# Patient Record
Sex: Male | Born: 1990 | Race: Black or African American | Hispanic: No | Marital: Single | Smoking: Current some day smoker
Health system: Southern US, Community
[De-identification: ages and names within clinical notes are randomized; demographics above are authoritative.]

## PROBLEM LIST (undated history)

## (undated) DIAGNOSIS — T7840XA Allergy, unspecified, initial encounter: Secondary | ICD-10-CM

## (undated) DIAGNOSIS — J189 Pneumonia, unspecified organism: Secondary | ICD-10-CM

## (undated) DIAGNOSIS — G43909 Migraine, unspecified, not intractable, without status migrainosus: Secondary | ICD-10-CM

## (undated) HISTORY — DX: Pneumonia, unspecified organism: J18.9

## (undated) HISTORY — DX: Allergy, unspecified, initial encounter: T78.40XA

## (undated) HISTORY — DX: Migraine, unspecified, not intractable, without status migrainosus: G43.909

## (undated) HISTORY — PX: NO PAST SURGERIES: SHX2092

## (undated) HISTORY — PX: FRACTURE SURGERY: SHX138

---
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
## 2012-06-16 ENCOUNTER — Ambulatory Visit: Payer: BC Managed Care – PPO | Admitting: Family Medicine

## 2012-06-16 VITALS — BP 120/80 | HR 66 | Temp 98.6°F | Resp 16 | Ht 67.0 in | Wt 149.0 lb

## 2012-06-16 DIAGNOSIS — M545 Low back pain, unspecified: Secondary | ICD-10-CM | POA: Insufficient documentation

## 2012-06-16 DIAGNOSIS — M542 Cervicalgia: Secondary | ICD-10-CM | POA: Insufficient documentation

## 2012-06-16 MED ORDER — MELOXICAM 15 MG PO TABS: 15.0000 mg | ORAL_TABLET | Freq: Every day | ORAL | Status: DC

## 2012-06-16 MED ORDER — CYCLOBENZAPRINE HCL 10 MG PO TABS: 10.0000 mg | ORAL_TABLET | Freq: Three times a day (TID) | ORAL | Status: DC | PRN
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
## 2012-06-16 NOTE — Patient Instructions (Addendum)
Thank you for coming in today. You have injured the small muscles in your back and spine. Take meloxicam and Flexeril as needed for pain. Return to work in one to 3 days Come back or go to the emergency room if you notice new weakness new numbness problems walking or bowel or bladder problems.

## 2012-11-10 ENCOUNTER — Ambulatory Visit: Payer: BC Managed Care – PPO | Admitting: Physician Assistant

## 2012-11-10 VITALS — BP 124/78 | HR 60 | Temp 98.4°F | Resp 16 | Ht 66.0 in | Wt 146.0 lb

## 2012-11-10 DIAGNOSIS — J309 Allergic rhinitis, unspecified: Secondary | ICD-10-CM

## 2012-11-10 DIAGNOSIS — J029 Acute pharyngitis, unspecified: Secondary | ICD-10-CM

## 2012-11-10 NOTE — Progress Notes (Signed)
   [address], [place] [address]   Phone [phone]  Subjective:    Patient ID: Chris West, male    DOB: Jul 16, 1990, 22 y.o.   MRN: [id number]  HPI Pt presents to clinic with 1 wk h/o sore throat.  It overall has gotten worse.  Pt does not feel sick - he has known seasonal allergies but he does not feel like they are bad right now. He does not have heartburn.  His throat mainly hurts on the R side.  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  He has not had any exposures to strep that he knows of.  Using cough drops but not really helping  Review of Systems  Constitutional: Negative for fever and chills.  HENT: Positive for sore throat (gets slightly better during the day but never goes away). Negative for congestion, rhinorrhea and postnasal drip.   Respiratory: Negative for cough.        Objective:   Physical Exam  Vitals reviewed. Constitutional: He is oriented to person, place, and time. He appears well-developed and well-nourished.  HENT:  Head: Normocephalic and atraumatic.  Right Ear: Hearing, tympanic membrane, external ear and ear canal normal.  Left Ear: Hearing, tympanic membrane, external ear and ear canal normal.  Nose: Mucosal edema (pale and swollen) present.  Mouth/Throat: Uvula is midline. Edematous (mild) and lacerations present. No oropharyngeal exudate, posterior oropharyngeal edema, posterior oropharyngeal erythema or tonsillar abscesses.  Allergic shiners bilateral.  Cardiovascular: Normal rate, regular rhythm and normal heart sounds.   No murmur heard. Pulmonary/Chest: Effort normal and breath sounds normal.  Lymphadenopathy:    He has no cervical adenopathy.  Neurological: He is alert and oriented to person, place, and time.  Skin: Skin is warm and dry.  Psychiatric: He has a normal mood and affect. His behavior is normal. Judgment and thought content normal.       Assessment & Plan:  Sore throat - I think that pt is having more  significant allergies that he realizes. He has Nasonex that he will start using daily.  He will use Motrin to help with pain control.   Benny Lennert PA-C 11/10/2012 8:38 PM

## 2012-11-25 ENCOUNTER — Emergency Department (HOSPITAL_COMMUNITY): Payer: Worker's Compensation

## 2012-11-25 ENCOUNTER — Emergency Department (HOSPITAL_COMMUNITY)
Admission: EM | Admit: 2012-11-25 | Discharge: 2012-11-25 | Disposition: A | Discharge status: Home / Self Care | Payer: Worker's Compensation | Attending: Emergency Medicine | Admitting: Emergency Medicine

## 2012-11-25 ENCOUNTER — Encounter (HOSPITAL_COMMUNITY): Payer: Self-pay | Admitting: Emergency Medicine

## 2012-11-25 DIAGNOSIS — M5126 Other intervertebral disc displacement, lumbar region: Secondary | ICD-10-CM | POA: Insufficient documentation

## 2012-11-25 DIAGNOSIS — Y939 Activity, unspecified: Secondary | ICD-10-CM | POA: Insufficient documentation

## 2012-11-25 DIAGNOSIS — S0990XA Unspecified injury of head, initial encounter: Secondary | ICD-10-CM | POA: Insufficient documentation

## 2012-11-25 DIAGNOSIS — W19XXXA Unspecified fall, initial encounter: Secondary | ICD-10-CM

## 2012-11-25 DIAGNOSIS — Z23 Encounter for immunization: Secondary | ICD-10-CM | POA: Insufficient documentation

## 2012-11-25 DIAGNOSIS — Y929 Unspecified place or not applicable: Secondary | ICD-10-CM | POA: Insufficient documentation

## 2012-11-25 DIAGNOSIS — W1789XA Other fall from one level to another, initial encounter: Secondary | ICD-10-CM | POA: Insufficient documentation

## 2012-11-25 DIAGNOSIS — S76011A Strain of muscle, fascia and tendon of right hip, initial encounter: Secondary | ICD-10-CM

## 2012-11-25 DIAGNOSIS — IMO0002 Reserved for concepts with insufficient information to code with codable children: Secondary | ICD-10-CM | POA: Insufficient documentation

## 2012-11-25 DIAGNOSIS — S1093XA Contusion of unspecified part of neck, initial encounter: Secondary | ICD-10-CM | POA: Insufficient documentation

## 2012-11-25 DIAGNOSIS — S0003XA Contusion of scalp, initial encounter: Secondary | ICD-10-CM | POA: Insufficient documentation

## 2012-11-25 DIAGNOSIS — S0993XA Unspecified injury of face, initial encounter: Secondary | ICD-10-CM | POA: Insufficient documentation

## 2012-11-25 MED ORDER — OXYCODONE-ACETAMINOPHEN 5-325 MG PO TABS: 1.0000 | ORAL_TABLET | ORAL | Status: DC | PRN

## 2012-11-25 MED ORDER — FENTANYL CITRATE 0.05 MG/ML IJ SOLN
50.0000 ug | Freq: Once | INTRAMUSCULAR | Status: AC
  Administered 2012-11-25: 50 ug via INTRAVENOUS
  Filled 2012-11-25: qty 2

## 2012-11-25 MED ORDER — HYDROMORPHONE HCL PF 1 MG/ML IJ SOLN
1.0000 mg | Freq: Once | INTRAMUSCULAR | Status: DC
  Filled 2012-11-25: qty 1

## 2012-11-25 MED ORDER — OXYCODONE-ACETAMINOPHEN 5-325 MG PO TABS
2.0000 | ORAL_TABLET | Freq: Once | ORAL | Status: AC
  Administered 2012-11-25: 2 via ORAL
  Filled 2012-11-25: qty 2

## 2012-11-25 MED ORDER — KETOROLAC TROMETHAMINE 30 MG/ML IJ SOLN
30.0000 mg | Freq: Once | INTRAMUSCULAR | Status: AC
  Administered 2012-11-25: 30 mg via INTRAVENOUS
  Filled 2012-11-25: qty 1

## 2012-11-25 MED ORDER — HYDROMORPHONE HCL PF 2 MG/ML IJ SOLN: 2.0000 mg | Freq: Once | INTRAMUSCULAR | Status: DC

## 2012-11-25 MED ORDER — TETANUS-DIPHTH-ACELL PERTUSSIS 5-2.5-18.5 LF-MCG/0.5 IM SUSP
0.5000 mL | Freq: Once | INTRAMUSCULAR | Status: AC
  Administered 2012-11-25: 0.5 mL via INTRAMUSCULAR
  Filled 2012-11-25: qty 0.5

## 2012-11-25 NOTE — ED Notes (Signed)
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 Pt c/o pain to right hip/thigh, pain neck/thoracic spine. Multiple abrasions and hematoma to right side head. No LOC. A/O x4

## 2012-11-25 NOTE — ED Provider Notes (Signed)
History     CSN: [id number]  Arrival date & time 11/25/12  1229   First MD Initiated Contact with Patient 11/25/12 1236      Chief Complaint  Patient presents with  . Fall    (Consider location/radiation/quality/duration/timing/severity/associated sxs/prior treatment) HPI Comments: Patient arrives via EMS after falling about 10-12 feet from a forklift onto the concrete. Denies losing consciousness. Complains of pain in his right hip and thigh and low back. No previous back injury. No weakness, numbness or tingling. No chest pain, neck pain, abdominal pain.   The history is provided by the patient and the EMS personnel.    Past Medical History  Diagnosis Date  . Allergy     Past Surgical History  Procedure Laterality Date  . Fracture surgery      No family history on file.  History  Substance Use Topics  . Smoking status: Never Smoker   . Smokeless tobacco: Never Used  . Alcohol Use: No      Review of Systems  Constitutional: Negative for fever, activity change and appetite change.  HENT: Negative for congestion and rhinorrhea.   Respiratory: Negative for cough, chest tightness and shortness of breath.   Cardiovascular: Negative for chest pain.  Gastrointestinal: Negative for nausea, vomiting and abdominal pain.  Genitourinary: Negative for dysuria and hematuria.  Musculoskeletal: Positive for myalgias, back pain and arthralgias.  Skin: Negative for rash.  Neurological: Positive for headaches. Negative for dizziness, weakness and light-headedness.  A complete 10 system review of systems was obtained and all systems are negative except as noted in the HPI and PMH.    Allergies  Review of patient's allergies indicates no known allergies.  Home Medications   Current Outpatient Rx  Name  Route  Sig  Dispense  Refill  . Multiple Vitamin (MULTIVITAMIN) tablet   Oral   Take 1 tablet by mouth daily.         Marland Kitchen oxyCODONE-acetaminophen (PERCOCET/ROXICET) 5-325  MG per tablet   Oral   Take 1 tablet by mouth every 4 (four) hours as needed for pain.   15 tablet   0     BP 137/67  Pulse 89  Temp(Src) 98.4 F (36.9 C) (Oral)  Resp 18  SpO2 98%  Physical Exam  Constitutional: He is oriented to person, place, and time. He appears well-developed and well-nourished. No distress.  HENT:  Head: Normocephalic.  Mouth/Throat: Oropharynx is clear and moist. No oropharyngeal exudate.  Right parietal scalp hematoma  Eyes: Conjunctivae and EOM are normal.  Neck: Normal range of motion. Neck supple.  Paraspinal cervical pain without midline tenderness  Cardiovascular: Normal rate, regular rhythm, normal heart sounds and intact distal pulses.   No murmur heard. Pulmonary/Chest: Effort normal and breath sounds normal. No respiratory distress.  Abdominal: Soft. There is no tenderness. There is no rebound and no guarding.  Genitourinary:  Normal tone, no gross blood  Musculoskeletal: Normal range of motion. He exhibits tenderness.  Tender to palpation over right iliac crest, right lateral pelvis.  No shortening or external rotation. Tender to palpation diffusely in the lumbar spine  Neurological: He is alert and oriented to person, place, and time. No cranial nerve deficit. He exhibits normal muscle tone. Coordination normal.  Skin: Skin is warm.    ED Course  Procedures (including critical care time)  Labs Reviewed - No data to display Dg Chest 1 View  11/25/2012   *RADIOLOGY REPORT*  Clinical Data: Fall.  CHEST - 1 VIEW  Comparison: Thoracic spine and 05/21/2007  Findings: The lungs are clear bilaterally. Heart and mediastinum are within normal limits.  The trachea is midline.  No evidence for a pneumothorax.  The ribs are grossly intact.  IMPRESSION: No acute cardiopulmonary disease.   Original Report Authenticated By: Richarda Overlie, M.D.   Dg Hip Complete Right  11/25/2012   *RADIOLOGY REPORT*  Clinical Data: Fall and right hip pain.  RIGHT HIP -  COMPLETE 2+ VIEW  Comparison: Right femur 11/25/2012  Findings: AP view of the pelvis and two views of the right hip were obtained.  The pelvic bony ring is intact.  The sacroiliac joints are symmetric.  Right hip is located without acute fracture.  IMPRESSION: No acute bony abnormality to the right hip or pelvis.   Original Report Authenticated By: Richarda Overlie, M.D.   Dg Femur Right  11/25/2012   *RADIOLOGY REPORT*  Clinical Data: Fall and right femur pain.  RIGHT FEMUR - 2 VIEW  Comparison: Right hip 11/25/2012  Findings: Normal appearance of the right femur without fracture or dislocation.  No gross soft tissue abnormality.  IMPRESSION: No acute bony abnormality.   Original Report Authenticated By: Richarda Overlie, M.D.   Ct Head Wo Contrast  11/25/2012   *RADIOLOGY REPORT*  Clinical Data:  10 feet fall.  Right-sided head pain.  Left-sided neck pain.  The  CT HEAD WITHOUT CONTRAST CT CERVICAL SPINE WITHOUT CONTRAST  Technique:  Multidetector CT imaging of the head and cervical spine was performed following the standard protocol without intravenous contrast.  Multiplanar CT image reconstructions of the cervical spine were also generated.  Comparison:  CT of the cervical spine 05/21/2007.  CT HEAD  Findings: The no acute cortical infarct, hemorrhage, or mass lesion is present.  The ventricles are of normal size.  No significant extra-axial fluid collection is present.  A right parietal scalp hematoma is present.  There is no underlying fracture.  Mild mucosal thickening is present in the anterior ethmoid air cells bilaterally.  A fluid level is present in the left maxillary sinus.  The there is no associated fracture of the. The osseous skull is intact.  IMPRESSION:  1.  The focal the right parietal scalp hematoma.  M2 no underlying fracture. 3.  No acute intracranial abnormality. 4.  Bilateral ethmoid and left maxillary sinus disease  CT CERVICAL SPINE  Findings: The cervical spine is imaged from skull base through  the midbody of T2.  Vertebral body heights and alignment are maintained.  No acute fracture or traumatic subluxation is evident. Straightening and some reversal of the normal cervical lordosis is likely positional as the patient is in a hard collar.  IMPRESSION:  1.  Negative CT of the cervical spine.   Original Report Authenticated By: Marin Roberts, M.D.   Ct Cervical Spine Wo Contrast  11/25/2012   *RADIOLOGY REPORT*  Clinical Data:  10 feet fall.  Right-sided head pain.  Left-sided neck pain.  The  CT HEAD WITHOUT CONTRAST CT CERVICAL SPINE WITHOUT CONTRAST  Technique:  Multidetector CT imaging of the head and cervical spine was performed following the standard protocol without intravenous contrast.  Multiplanar CT image reconstructions of the cervical spine were also generated.  Comparison:  CT of the cervical spine 05/21/2007.  CT HEAD  Findings: The no acute cortical infarct, hemorrhage, or mass lesion is present.  The ventricles are of normal size.  No significant extra-axial fluid collection is present.  A right parietal scalp hematoma is present.  There is no underlying fracture.  Mild mucosal thickening is present in the anterior ethmoid air cells bilaterally.  A fluid level is present in the left maxillary sinus.  The there is no associated fracture of the. The osseous skull is intact.  IMPRESSION:  1.  The focal the right parietal scalp hematoma.  M2 no underlying fracture. 3.  No acute intracranial abnormality. 4.  Bilateral ethmoid and left maxillary sinus disease  CT CERVICAL SPINE  Findings: The cervical spine is imaged from skull base through the midbody of T2.  Vertebral body heights and alignment are maintained.  No acute fracture or traumatic subluxation is evident. Straightening and some reversal of the normal cervical lordosis is likely positional as the patient is in a hard collar.  IMPRESSION:  1.  Negative CT of the cervical spine.   Original Report Authenticated By: Marin Roberts, M.D.   Ct Lumbar Spine Wo Contrast  11/25/2012   *RADIOLOGY REPORT*  Clinical Data: Fall.  Right-sided low back pain.  CT LUMBAR SPINE WITHOUT CONTRAST  Technique:  Multidetector CT imaging of the lumbar spine was performed without intravenous contrast administration. Multiplanar CT image reconstructions were also generated.  Comparison: None.  Findings: The lumbar spine is imaged from midbody of T12 through S2- 3.  Vertebral body heights and alignment maintained.  The soft tissues are unremarkable.  There is no acute fracture or traumatic subluxation.  IMPRESSION: Negative CT of the lumbar spine.   Original Report Authenticated By: Marin Roberts, M.D.   Ct Pelvis Wo Contrast  11/25/2012   *RADIOLOGY REPORT*  Clinical Data: Fall and right hip pain.  CT PELVIS WITHOUT CONTRAST  Technique:  Multidetector CT imaging of the pelvis was performed following the standard protocol without intravenous contrast.  Comparison: Lumbar spine CT 11/25/2012  Findings: Negative for a pelvic bone fracture.  Both hips are located and no fracture in the proximal femurs.  Fluid in the urinary bladder.  No gross soft tissue abnormality.  Normal appearance of the sacroiliac joints.  There is mild retrolisthesis at L5-S1 and similar to the lumbar spine CT.  IMPRESSION: Negative pelvic CT.  No acute bony abnormality.   Original Report Authenticated By: Richarda Overlie, M.D.     1. Minor head injury, initial encounter   2. Hip strain, right, initial encounter   3. Fall, initial encounter       MDM  Patient presents via EMS after a fall while standing on a forklift at about 10 feet. Complains of pain in his right hip, thigh, neck. No loss of consciousness. Moving all extremities. No chest pain or abdominal pain.   X-rays of hip, lumbar spine and pelvis are negative for fracture. Patient reports pelvic pain appears to remain severe he is unable to ambulate. Will check CT pelvis to rule out occult fracture. CT head  and C spine negative.  CT pelvis negative for fracture. Patient still refuses to ambulate. Tenses is up muscles and does not want to bend the thigh or knee. Patient seen at bedside with Dr. Lindie Spruce. Dr. Lindie Spruce recommended additional dose of pain medication with Toradol and encouraged patient to ambulate. He does not think patient will benefit from hospital admission. No evidence of compartment syndrome or spinal cord injury. Additional medications given and signed out to Dr. Bebe Shaggy to assess response.   Glynn Octave, MD 11/25/12 [phone]

## 2012-11-25 NOTE — ED Provider Notes (Signed)
Assumed care in signout  Pt is now walking He has no focal motor deficits in his extremities He has no other new complaints Discussed strict return precautions and need to be out of work until next week Pt/family agreeable   Joya Gaskins, MD 11/25/12 1843

## 2012-11-25 NOTE — ED Notes (Signed)
Per tech, pt refuses to ambulate.  States "it hurts too much and i can't"

## 2012-11-25 NOTE — ED Notes (Signed)
Family at bedside speaking with Dr.Wickline

## 2012-11-25 NOTE — ED Notes (Signed)
NAD noted at time of d/c home 

## 2012-11-25 NOTE — ED Notes (Signed)
Pt OOB ambulating in hallway slow/steady to BR.

## 2012-12-15 ENCOUNTER — Ambulatory Visit: Payer: BC Managed Care – PPO

## 2013-05-17 ENCOUNTER — Ambulatory Visit: Payer: BC Managed Care – PPO | Admitting: Family Medicine

## 2013-05-17 ENCOUNTER — Telehealth: Payer: Self-pay | Admitting: Radiology

## 2013-05-17 ENCOUNTER — Ambulatory Visit: Payer: BC Managed Care – PPO

## 2013-05-17 VITALS — BP 120/80 | HR 106 | Temp 97.9°F | Resp 20 | Ht 66.0 in | Wt 144.0 lb

## 2013-05-17 DIAGNOSIS — R05 Cough: Secondary | ICD-10-CM

## 2013-05-17 DIAGNOSIS — R0602 Shortness of breath: Secondary | ICD-10-CM

## 2013-05-17 LAB — POCT CBC
Hemoglobin: 15.2 g/dL (ref 14.1–18.1)
Lymph, poc: 0.9 (ref 0.6–3.4)
MCH, POC: 23.7 pg — AB (ref 27–31.2)
MCHC: 31.8 g/dL (ref 31.8–35.4)
MID (cbc): 0.3 (ref 0–0.9)
MPV: 9.2 fL (ref 0–99.8)
POC LYMPH PERCENT: 15.6 %L (ref 10–50)
POC MID %: 5.8 %M (ref 0–12)
Platelet Count, POC: 229 10*3/uL (ref 142–424)
RDW, POC: 14.9 %
WBC: 5.6 10*3/uL (ref 4.6–10.2)

## 2013-05-17 MED ORDER — ALBUTEROL SULFATE (2.5 MG/3ML) 0.083% IN NEBU: 2.5000 mg | INHALATION_SOLUTION | Freq: Once | RESPIRATORY_TRACT | Status: DC

## 2013-05-17 MED ORDER — AZITHROMYCIN 250 MG PO TABS: ORAL_TABLET | ORAL | Status: DC

## 2013-05-17 MED ORDER — IPRATROPIUM BROMIDE 0.02 % IN SOLN: 0.5000 mg | Freq: Once | RESPIRATORY_TRACT | Status: DC

## 2013-05-17 MED ORDER — PREDNISONE 20 MG PO TABS: ORAL_TABLET | ORAL | Status: DC

## 2013-05-17 MED ORDER — HYDROCODONE-HOMATROPINE 5-1.5 MG/5ML PO SYRP: ORAL_SOLUTION | ORAL | Status: DC

## 2013-05-17 NOTE — Telephone Encounter (Signed)
Called and let him know that his D dimer is negative.  He will proceed with plan for azithromycin, pred and hycodan as per Eula Listen, PA-C.  He is glad that his test is negative and will follow-up if not better soon

## 2013-05-17 NOTE — Progress Notes (Signed)
Patient ID: Chris West MRN: [id number], DOB: February 01, 1991, 22 y.o. Date of Encounter: 05/17/2013, 10:33 AM  Primary Physician: No primary provider on file.  Chief Complaint:  Chief Complaint  Patient presents with  . Cough    Fever, nasal drainage x 2 days    HPI: 22 y.o. male presents with a 2 day history of cough, wheezing, SOB. Some nasal congestion and rhinorrhea. Subjective fever and chills the previous day. Nasal congestion thick and green/yellow. Cough is not productive and not associated with time of day. Some SOB and wheezing. No history of asthma. Some central chest pain, not exertional. Normal hearing. Has tried OTC cold preps without success. No GI complaints. Appetite decreased. Patient states that he felt like he was getting a cold on 05/14/13.   No sick contacts, recent antibiotics, or recent travels.   No leg trauma, sedentary periods, h/o cancer, or tobacco use.  No lower extremity swelling, erythema, or pain.   Past Medical History  Diagnosis Date  . Allergy      Home Meds: Prior to Admission medications   Medication Sig Start Date End Date Taking? Authorizing Provider  Multiple Vitamin (MULTIVITAMIN) tablet Take 1 tablet by mouth daily.   Yes Historical Provider, MD    Allergies: No Known Allergies  History   Social History  . Marital Status: Single    Spouse Name: N/A    Number of Children: N/A  . Years of Education: N/A   Occupational History  . Not on file.   Social History Main Topics  . Smoking status: Never Smoker   . Smokeless tobacco: Never Used  . Alcohol Use: No  . Drug Use: No  . Sexual Activity: Yes    Birth Control/ Protection: Condom   Other Topics Concern  . Not on file   Social History Narrative  . No narrative on file     Review of Systems: Constitutional: positive for fever, chills, and fatigue HEENT: see above Cardiovascular: positive for chest pain and tachycardia. negative for palpitations Respiratory:  positive for cough, wheezing, and SOB  Abdominal: negative for abdominal pain, nausea, vomiting or diarrhea Dermatological: negative for rash Neurologic: negative for headache   Physical Exam: Blood pressure 120/80, pulse 106, temperature 97.9 F (36.6 C), temperature source Oral, resp. rate 20, height 5\' 6"  (1.676 m), weight 144 lb (65.318 kg), SpO2 95.00%., Body mass index is 23.25 kg/(m^2). Status post albuterol and atrovent neb pulse ox improved to 98-98%. Pulse ranged from 98-101.  General: Well developed, well nourished, in no acute distress. Head: Normocephalic, atraumatic, eyes without discharge, sclera non-icteric, nares are congested. Bilateral auditory canals clear, TM's are without perforation, pearly grey with reflective cone of light bilaterally. No sinus TTP. Oral cavity moist, dentition normal. Posterior pharynx with post nasal drip and mild erythema. No peritonsillar abscess or tonsillar exudate. Uvula midline.  Neck: Supple. No thyromegaly. Full ROM. No lymphadenopathy. Lungs: Coarse breath sounds bilaterally without wheezes, rales, or rhonchi. Breathing is unlabored. Status post albuterol and atrovent neb: Patient reports some improvement, but still feels some SOB.  Heart: RRR with S1 S2. No murmurs, rubs, or gallops appreciated. Msk:  Strength and tone normal for age. Extremities: No clubbing or cyanosis. No edema. Neuro: Alert and oriented X 3. Moves all extremities spontaneously. CNII-XII grossly in tact. Psych:  Responds to questions appropriately with a normal affect.   Labs: Results for orders placed in visit on 05/17/13  POCT CBC      Result Value  Range   WBC 5.6  4.6 - 10.2 K/uL   Lymph, poc 0.9  0.6 - 3.4   POC LYMPH PERCENT 15.6  10 - 50 %L   MID (cbc) 0.3  0 - 0.9   POC MID % 5.8  0 - 12 %M   POC Granulocyte 4.4  2 - 6.9   Granulocyte percent 78.6  37 - 80 %G   RBC 6.42 (*) 4.69 - 6.13 M/uL   Hemoglobin 15.2  14.1 - 18.1 g/dL   HCT, POC 65.7  84.6 - 53.7  %   MCV 74.4 (*) 80 - 97 fL   MCH, POC 23.7 (*) 27 - 31.2 pg   MCHC 31.8  31.8 - 35.4 g/dL   RDW, POC 96.2     Platelet Count, POC 229  142 - 424 K/uL   MPV 9.2  0 - 99.8 fL  POCT INFLUENZA A/B      Result Value Range   Influenza A, POC Negative     Influenza B, POC Negative      [facility] reading (PRIMARY) by  Dr. Patsy Lager.  CXR: Negative   ASSESSMENT AND PLAN:  22 y.o. male with cough, SOB, and wheezing.  -STAT D dimer, call patient with results. If positive patient to proceed with CTA of chest and bilateral LE [place] -If negative will proceed with Azithromycin 250 MG #6 2 po first day then 1 po next 4 days no RF -Prednisone 20 mg #12 3x2, 2x2, 1x2 no RF -Hycodan #4oz 1 tsp po q 4-6 hours prn cough no RF SED -Mucinex -Tylenol/Motrin prn -Rest/fluids -RTC precautions -RTC 3-5 days if no improvement  Signed, Eula Listen, PA-C [facility] [place], [place] [address] [phone] 05/17/2013 10:33 AM

## 2013-05-17 NOTE — Telephone Encounter (Signed)
D dimer negative 0.30 call report.

## 2013-07-19 ENCOUNTER — Ambulatory Visit: Payer: BC Managed Care – PPO | Admitting: Emergency Medicine

## 2013-07-19 VITALS — BP 118/70 | HR 91 | Temp 99.2°F | Resp 18 | Ht 66.0 in | Wt 150.4 lb

## 2013-07-19 DIAGNOSIS — J111 Influenza due to unidentified influenza virus with other respiratory manifestations: Secondary | ICD-10-CM

## 2013-07-19 MED ORDER — PROMETHAZINE-CODEINE 6.25-10 MG/5ML PO SYRP: 5.0000 mL | ORAL_SOLUTION | Freq: Four times a day (QID) | ORAL | Status: DC | PRN

## 2013-07-19 MED ORDER — OSELTAMIVIR PHOSPHATE 75 MG PO CAPS: 75.0000 mg | ORAL_CAPSULE | Freq: Two times a day (BID) | ORAL | Status: DC

## 2013-07-19 NOTE — Progress Notes (Signed)
[facility] [address], [place] [id number] [phone]  Date:  07/19/2013   Name:  Chris KettleKenneth West   DOB:  May 25, 1991   MRN:  [id number]  PCP:  No primary provider on file.    Chief Complaint: Cough, Shortness of Breath, Fatigue and Sore Throat   History of Present Illness:  Chris KettleKenneth West is a 23 y.o. very pleasant male patient who presents with the following:  Ill since Sunday with fever and chills, cough that is productive of mucopurulent sputum.  Wheezing and exertional shortness of breath.  Has mucopurulent nasal drainage and post nasal drainage.  Malaise and weakness and fatigue.  No improvement with over the counter medications or other home remedies. Denies other complaint or health concern today.   Patient Active Problem List   Diagnosis Date Noted  . LBP (low back pain) 06/16/2012  . Neck pain 06/16/2012    Past Medical History  Diagnosis Date  . Allergy     Past Surgical History  Procedure Laterality Date  . Fracture surgery      History  Substance Use Topics  . Smoking status: Never Smoker   . Smokeless tobacco: Never Used  . Alcohol Use: No    Family History  Problem Relation Age of Onset  . Breast cancer Mother   . Healthy Father   . Healthy Sister   . Breast cancer Maternal Grandmother   . Healthy Maternal Grandfather   . Healthy Paternal Grandmother   . Healthy Paternal Grandfather     No Known Allergies  Medication list has been reviewed and updated.  Current Outpatient Prescriptions on File Prior to Visit  Medication Sig Dispense Refill  . azithromycin (ZITHROMAX Z-PAK) 250 MG tablet 2 tabs po first day, then 1 tab po next 4 days  6 tablet  0  . HYDROcodone-homatropine (HYCODAN) 5-1.5 MG/5ML syrup 1 TSP PO Q 4-6 HOURS PRN COUGH  120 mL  0  . Multiple Vitamin (MULTIVITAMIN) tablet Take 1 tablet by mouth daily.      . predniSONE (DELTASONE) 20 MG tablet 3 PO FOR 2 DAYS, 2 PO FOR 2 DAYS, 1 PO FOR 2 DAYS  12  tablet  0   Current Facility-Administered Medications on File Prior to Visit  Medication Dose Route Frequency Provider Last Rate Last Dose  . albuterol (PROVENTIL) (2.5 MG/3ML) 0.083% nebulizer solution 2.5 mg  2.5 mg Nebulization Once Ryan M Dunn, PA-C      . ipratropium (ATROVENT) nebulizer solution 0.5 mg  0.5 mg Nebulization Once Sondra Bargesyan M Dunn, PA-C        Review of Systems:  As per HPI, otherwise negative.    Physical Examination: Filed Vitals:   07/19/13 0934  BP: 118/70  Pulse: 91  Temp: 99.2 F (37.3 C)  Resp: 18   Filed Vitals:   07/19/13 0934  Height: 5\' 6"  (1.676 m)  Weight: 150 lb 6.4 oz (68.221 kg)   Body mass index is 24.29 kg/(m^2). Ideal Body Weight: Weight in (lb) to have BMI = 25: 154.6  GEN: WDWN, NAD, Non-toxic, A & O x 3 HEENT: Atraumatic, Normocephalic. Neck supple. No masses, No LAD. Ears and Nose: No external deformity. CV: RRR, No M/G/R. No JVD. No thrill. No extra heart sounds. PULM: CTA B, no wheezes, crackles, rhonchi. No retractions. No resp. distress. No accessory muscle use. ABD: S, NT, ND, +BS. No rebound. No HSM. EXTR: No c/c/e NEURO Normal gait.  PSYCH: Normally interactive. Conversant. Not depressed  or anxious appearing.  Calm demeanor.    Assessment and Plan: Influenza tamiflu Phen c cod   Signed,  Phillips Odor, MD

## 2013-07-19 NOTE — Patient Instructions (Signed)
inInfluenza, Adult Influenza ("the flu") is a viral infection of the respiratory tract. It occurs more often in winter months because people spend more time in close contact with one another. Influenza can make you feel very sick. Influenza easily spreads from person to person (contagious). CAUSES  Influenza is caused by a virus that infects the respiratory tract. You can catch the virus by breathing in droplets from an infected person's cough or sneeze. You can also catch the virus by touching something that was recently contaminated with the virus and then touching your mouth, nose, or eyes. SYMPTOMS  Symptoms typically last 4 to 10 days and may include:  Fever.  Chills.  Headache, body aches, and muscle aches.  Sore throat.  Chest discomfort and cough.  Poor appetite.  Weakness or feeling tired.  Dizziness.  Nausea or vomiting. DIAGNOSIS  Diagnosis of influenza is often made based on your history and a physical exam. A nose or throat swab test can be done to confirm the diagnosis. RISKS AND COMPLICATIONS You may be at risk for a more severe case of influenza if you smoke cigarettes, have diabetes, have chronic heart disease (such as heart failure) or lung disease (such as asthma), or if you have a weakened immune system. Elderly people and pregnant women are also at risk for more serious infections. The most common complication of influenza is a lung infection (pneumonia). Sometimes, this complication can require emergency medical care and may be life-threatening. PREVENTION  An annual influenza vaccination (flu shot) is the best way to avoid getting influenza. An annual flu shot is now routinely recommended for all adults in the [place] TREATMENT  In mild cases, influenza goes away on its own. Treatment is directed at relieving symptoms. For more severe cases, your caregiver may prescribe antiviral medicines to shorten the sickness. Antibiotic medicines are not effective, because the  infection is caused by a virus, not by bacteria. HOME CARE INSTRUCTIONS  Only take over-the-counter or prescription medicines for pain, discomfort, or fever as directed by your caregiver.  Use a cool mist humidifier to make breathing easier.  Get plenty of rest until your temperature returns to normal. This usually takes 3 to 4 days.  Drink enough fluids to keep your urine clear or pale yellow.  Cover your mouth and nose when coughing or sneezing, and wash your hands well to avoid spreading the virus.  Stay home from work or school until your fever has been gone for at least 1 full day. SEEK MEDICAL CARE IF:   You have chest pain or a deep cough that worsens or produces more mucus.  You have nausea, vomiting, or diarrhea. SEEK IMMEDIATE MEDICAL CARE IF:   You have difficulty breathing, shortness of breath, or your skin or nails turn bluish.  You have severe neck pain or stiffness.  You have a severe headache, facial pain, or earache.  You have a worsening or recurring fever.  You have nausea or vomiting that cannot be controlled. MAKE SURE YOU:  Understand these instructions.  Will watch your condition.  Will get help right away if you are not doing well or get worse. Document Released: 06/28/2000 Document Revised: 12/31/2011 Document Reviewed: 09/30/2011 [facility] Patient Information 2014 [employer] [place].

## 2013-12-09 ENCOUNTER — Ambulatory Visit: Payer: BC Managed Care – PPO | Admitting: Family Medicine

## 2013-12-09 VITALS — BP 122/86 | HR 60 | Temp 97.9°F | Resp 16 | Ht 66.5 in | Wt 147.8 lb

## 2013-12-09 DIAGNOSIS — K529 Noninfective gastroenteritis and colitis, unspecified: Secondary | ICD-10-CM

## 2013-12-09 DIAGNOSIS — K5289 Other specified noninfective gastroenteritis and colitis: Secondary | ICD-10-CM

## 2013-12-09 MED ORDER — PROMETHAZINE HCL 25 MG PO TABS: 25.0000 mg | ORAL_TABLET | Freq: Four times a day (QID) | ORAL | Status: DC | PRN

## 2013-12-09 NOTE — Patient Instructions (Signed)
Viral Gastroenteritis Viral gastroenteritis is also known as stomach flu. This condition affects the stomach and intestinal tract. It can cause sudden diarrhea and vomiting. The illness typically lasts 3 to 8 days. Most people develop an immune response that eventually gets rid of the virus. While this natural response develops, the virus can make you quite ill. CAUSES  Many different viruses can cause gastroenteritis, such as rotavirus or noroviruses. You can catch one of these viruses by consuming contaminated food or water. You may also catch a virus by sharing utensils or other personal items with an infected person or by touching a contaminated surface. SYMPTOMS  The most common symptoms are diarrhea and vomiting. These problems can cause a severe loss of body fluids (dehydration) and a body salt (electrolyte) imbalance. Other symptoms may include:  Fever.  Headache.  Fatigue.  Abdominal pain. DIAGNOSIS  Your caregiver can usually diagnose viral gastroenteritis based on your symptoms and a physical exam. A stool sample may also be taken to test for the presence of viruses or other infections. TREATMENT  This illness typically goes away on its own. Treatments are aimed at rehydration. The most serious cases of viral gastroenteritis involve vomiting so severely that you are not able to keep fluids down. In these cases, fluids must be given through an intravenous line (IV). HOME CARE INSTRUCTIONS   Drink enough fluids to keep your urine clear or pale yellow. Drink small amounts of fluids frequently and increase the amounts as tolerated.  Ask your caregiver for specific rehydration instructions.  Avoid:  Foods high in sugar.  Alcohol.  Carbonated drinks.  Tobacco.  Juice.  Caffeine drinks.  Extremely hot or cold fluids.  Fatty, greasy foods.  Too much intake of anything at one time.  Dairy products until 24 to 48 hours after diarrhea stops.  You may consume probiotics.  Probiotics are active cultures of beneficial bacteria. They may lessen the amount and number of diarrheal stools in adults. Probiotics can be found in yogurt with active cultures and in supplements.  Wash your hands well to avoid spreading the virus.  Only take over-the-counter or prescription medicines for pain, discomfort, or fever as directed by your caregiver. Do not give aspirin to children. Antidiarrheal medicines are not recommended.  Ask your caregiver if you should continue to take your regular prescribed and over-the-counter medicines.  Keep all follow-up appointments as directed by your caregiver. SEEK IMMEDIATE MEDICAL CARE IF:   You are unable to keep fluids down.  You do not urinate at least once every 6 to 8 hours.  You develop shortness of breath.  You notice blood in your stool or vomit. This may look like coffee grounds.  You have abdominal pain that increases or is concentrated in one small area (localized).  You have persistent vomiting or diarrhea.  You have a fever.  The patient is a child younger than 3 months, and he or she has a fever.  The patient is a child older than 3 months, and he or she has a fever and persistent symptoms.  The patient is a child older than 3 months, and he or she has a fever and symptoms suddenly get worse.  The patient is a baby, and he or she has no tears when crying. MAKE SURE YOU:   Understand these instructions.  Will watch your condition.  Will get help right away if you are not doing well or get worse. Document Released: 07/01/2005 Document Revised: 09/23/2011 Document Reviewed: 04/17/2011   Exit[employer] Patient Information 2014 [employer].  

## 2013-12-09 NOTE — Progress Notes (Addendum)
   Subjective:  This chart was scribed for Chris Sorenson, MD by Bronson Curb, ED Scribe. This patient was seen in room Room/bed 9 and the patient's care was started at 10:45 AM.   Patient ID: Chris West, male    DOB: [birth year], 22 y.o.   MRN: [id number]  Chief Complaint  Patient presents with  . Emesis    this morning  . Diarrhea    this morning    HPI  HPI Comments: Chris West is a 23 y.o. male who presents to [facility] complaining of diarrhea onset this morning. Patient states he went out to eat and became sick later that night. There is associated nausea, emesis, mild abdominal cramps, and chills. He states he can tolerate liquids, but prefers not to drink anything. He denies fevers and urinary symptoms. Patient has not taken anything to relieve the symptoms.  Past Medical History  Diagnosis Date  . Allergy    Current Outpatient Prescriptions on File Prior to Visit  Medication Sig Dispense Refill  . Multiple Vitamin (MULTIVITAMIN) tablet Take 1 tablet by mouth daily.       Current Facility-Administered Medications on File Prior to Visit  Medication Dose Route Frequency Provider Last Rate Last Dose  . albuterol (PROVENTIL) (2.5 MG/3ML) 0.083% nebulizer solution 2.5 mg  2.5 mg Nebulization Once Ryan M Dunn, PA-C      . ipratropium (ATROVENT) nebulizer solution 0.5 mg  0.5 mg Nebulization Once Ryan M Dunn, PA-C       No Known Allergies  Review of Systems  Constitutional: Positive for chills. Negative for fever.  Gastrointestinal: Positive for nausea, vomiting and diarrhea.      BP 122/86  Pulse 60  Temp(Src) 97.9 F (36.6 C) (Oral)  Resp 16  Ht 5' 6.5" (1.689 m)  Wt 147 lb 12.8 oz (67.042 kg)  BMI 23.50 kg/m2  SpO2 100%  Objective:   Physical Exam  Nursing note and vitals reviewed. Constitutional: He is oriented to person, place, and time. He appears well-developed and well-nourished. No distress.  HENT:  Head: Normocephalic and atraumatic.  Eyes: EOM  are normal.  Neck: Neck supple. No tracheal deviation present. No mass and no thyromegaly present.  Cardiovascular: Normal rate, regular rhythm and normal heart sounds.   Pulmonary/Chest: Effort normal and breath sounds normal. No respiratory distress.  Abdominal: Soft. Bowel sounds are normal. He exhibits no distension and no mass. There is no hepatosplenomegaly. There is no tenderness. There is no CVA tenderness.  Musculoskeletal: Normal range of motion.  Lymphadenopathy:    He has no cervical adenopathy.  Neurological: He is alert and oriented to person, place, and time.  Skin: Skin is warm and dry.  Psychiatric: He has a normal mood and affect. His behavior is normal.          Assessment & Plan:    Okay to call for Zofran if needed.  Gastroenteritis  Meds ordered this encounter  Medications  . promethazine (PHENERGAN) 25 MG tablet    Sig: Take 1 tablet (25 mg total) by mouth every 6 (six) hours as needed for nausea or vomiting.    Dispense:  20 tablet    Refill:  0    I personally performed the services described in this documentation, which was scribed in my presence. The recorded information has been reviewed and considered, and addended by me as needed.  Chris Sorenson, MD MPH

## 2014-03-07 ENCOUNTER — Ambulatory Visit (INDEPENDENT_AMBULATORY_CARE_PROVIDER_SITE_OTHER): Payer: BC Managed Care – PPO | Admitting: Emergency Medicine

## 2014-03-07 VITALS — BP 102/68 | HR 57 | Temp 98.2°F | Resp 16 | Ht 66.0 in | Wt 149.0 lb

## 2014-03-07 DIAGNOSIS — M545 Low back pain, unspecified: Secondary | ICD-10-CM

## 2014-03-07 MED ORDER — NAPROXEN SODIUM 550 MG PO TABS: 550.0000 mg | ORAL_TABLET | Freq: Two times a day (BID) | ORAL | Status: DC

## 2014-03-07 MED ORDER — TRAMADOL HCL 50 MG PO TABS: 50.0000 mg | ORAL_TABLET | Freq: Three times a day (TID) | ORAL | Status: DC | PRN

## 2014-03-07 MED ORDER — CYCLOBENZAPRINE HCL 10 MG PO TABS: 10.0000 mg | ORAL_TABLET | Freq: Three times a day (TID) | ORAL | Status: DC | PRN
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
## 2014-03-07 NOTE — Patient Instructions (Signed)
Back Pain, Adult Low back pain is very common. About 1 in 5 people have back pain.The cause of low back pain is rarely dangerous. The pain often gets better over time.About half of people with a sudden onset of back pain feel better in just 2 weeks. About 8 in 10 people feel better by 6 weeks.  CAUSES Some common causes of back pain include:  Strain of the muscles or ligaments supporting the spine.  Wear and tear (degeneration) of the spinal discs.  Arthritis.  Direct injury to the back. DIAGNOSIS Most of the time, the direct cause of low back pain is not known.However, back pain can be treated effectively even when the exact cause of the pain is unknown.Answering your caregiver's questions about your overall health and symptoms is one of the most accurate ways to make sure the cause of your pain is not dangerous. If your caregiver needs more information, he or she may order lab work or imaging tests (X-rays or MRIs).However, even if imaging tests show changes in your back, this usually does not require surgery. HOME CARE INSTRUCTIONS For many people, back pain returns.Since low back pain is rarely dangerous, it is often a condition that people can learn to manageon their own.   Remain active. It is stressful on the back to sit or stand in one place. Do not sit, drive, or stand in one place for more than 30 minutes at a time. Take short walks on level surfaces as soon as pain allows.Try to increase the length of time you walk each day.  Do not stay in bed.Resting more than 1 or 2 days can delay your recovery.  Do not avoid exercise or work.Your body is made to move.It is not dangerous to be active, even though your back may hurt.Your back will likely heal faster if you return to being active before your pain is gone.  Pay attention to your body when you bend and lift. Many people have less discomfortwhen lifting if they bend their knees, keep the load close to their bodies,and  avoid twisting. Often, the most comfortable positions are those that put less stress on your recovering back.  Find a comfortable position to sleep. Use a firm mattress and lie on your side with your knees slightly bent. If you lie on your back, put a pillow under your knees.  Only take over-the-counter or prescription medicines as directed by your caregiver. Over-the-counter medicines to reduce pain and inflammation are often the most helpful.Your caregiver may prescribe muscle relaxant drugs.These medicines help dull your pain so you can more quickly return to your normal activities and healthy exercise.  Put ice on the injured area.  Put ice in a plastic bag.  Place a towel between your skin and the bag.  Leave the ice on for 15-20 minutes, 03-04 times a day for the first 2 to 3 days. After that, ice and heat may be alternated to reduce pain and spasms.  Ask your caregiver about trying back exercises and gentle massage. This may be of some benefit.  Avoid feeling anxious or stressed.Stress increases muscle tension and can worsen back pain.It is important to recognize when you are anxious or stressed and learn ways to manage it.Exercise is a great option. SEEK MEDICAL CARE IF:  You have pain that is not relieved with rest or medicine.  You have pain that does not improve in 1 week.  You have new symptoms.  You are generally not feeling well. SEEK   IMMEDIATE MEDICAL CARE IF:   You have pain that radiates from your back into your legs.  You develop new bowel or bladder control problems.  You have unusual weakness or numbness in your arms or legs.  You develop nausea or vomiting.  You develop abdominal pain.  You feel faint. Document Released: 07/01/2005 Document Revised: 12/31/2011 Document Reviewed: 11/02/2013 Exit[employer] Patient Information 2015 [employer]. This information is not intended to replace advice given to you by your health care provider. Make sure you  discuss any questions you have with your health care provider.  

## 2014-03-09 ENCOUNTER — Emergency Department (HOSPITAL_COMMUNITY)
Admission: EM | Admit: 2014-03-09 | Discharge: 2014-03-10 | Disposition: A | Discharge status: Home / Self Care | Payer: Worker's Compensation | Attending: Emergency Medicine | Admitting: Emergency Medicine

## 2014-03-09 ENCOUNTER — Encounter (HOSPITAL_COMMUNITY): Payer: Self-pay | Admitting: Emergency Medicine

## 2014-03-09 DIAGNOSIS — M549 Dorsalgia, unspecified: Secondary | ICD-10-CM | POA: Diagnosis present

## 2014-03-09 DIAGNOSIS — R5383 Other fatigue: Secondary | ICD-10-CM | POA: Diagnosis not present

## 2014-03-09 DIAGNOSIS — M5441 Lumbago with sciatica, right side: Secondary | ICD-10-CM

## 2014-03-09 DIAGNOSIS — R5381 Other malaise: Secondary | ICD-10-CM | POA: Insufficient documentation

## 2014-03-09 DIAGNOSIS — M5442 Lumbago with sciatica, left side: Secondary | ICD-10-CM

## 2014-03-09 DIAGNOSIS — M5430 Sciatica, unspecified side: Secondary | ICD-10-CM | POA: Diagnosis not present

## 2014-03-09 LAB — URINALYSIS, ROUTINE W REFLEX MICROSCOPIC
BILIRUBIN URINE: NEGATIVE
Glucose, UA: NEGATIVE mg/dL
Hgb urine dipstick: NEGATIVE
Ketones, ur: NEGATIVE mg/dL
Leukocytes, UA: NEGATIVE
NITRITE: NEGATIVE
PROTEIN: NEGATIVE mg/dL
SPECIFIC GRAVITY, URINE: 1.029 (ref 1.005–1.030)
UROBILINOGEN UA: 1 mg/dL (ref 0.0–1.0)
pH: 6 (ref 5.0–8.0)

## 2014-03-09 MED ORDER — PREDNISONE 10 MG PO TABS: 20.0000 mg | ORAL_TABLET | Freq: Every day | ORAL | Status: DC

## 2014-03-09 MED ORDER — DEXAMETHASONE SODIUM PHOSPHATE 10 MG/ML IJ SOLN
10.0000 mg | Freq: Once | INTRAMUSCULAR | Status: AC
  Administered 2014-03-09: 10 mg via INTRAVENOUS
  Filled 2014-03-09: qty 1

## 2014-03-09 MED ORDER — HYDROCODONE-ACETAMINOPHEN 5-325 MG PO TABS: 1.0000 | ORAL_TABLET | ORAL | Status: DC | PRN

## 2014-03-09 MED ORDER — ONDANSETRON HCL 4 MG/2ML IJ SOLN
4.0000 mg | Freq: Once | INTRAMUSCULAR | Status: AC
  Administered 2014-03-09: 4 mg via INTRAVENOUS
  Filled 2014-03-09: qty 2

## 2014-03-09 MED ORDER — MORPHINE SULFATE 4 MG/ML IJ SOLN
4.0000 mg | Freq: Once | INTRAMUSCULAR | Status: AC
  Administered 2014-03-09: 4 mg via INTRAVENOUS
  Filled 2014-03-09: qty 1

## 2014-03-09 MED ORDER — CYCLOBENZAPRINE HCL 10 MG PO TABS: 10.0000 mg | ORAL_TABLET | Freq: Two times a day (BID) | ORAL | Status: DC | PRN

## 2014-03-09 MED ORDER — DIAZEPAM 5 MG PO TABS
5.0000 mg | ORAL_TABLET | Freq: Once | ORAL | Status: AC
  Administered 2014-03-10: 5 mg via ORAL
  Filled 2014-03-09: qty 1
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
## 2014-03-09 NOTE — ED Notes (Signed)
Pt reports lower back pain for over 1 week and now having pain in groin when he urinates. Pt also reports nausea.

## 2014-03-09 NOTE — Discharge Instructions (Signed)
Back Pain, Adult Low back pain is very common. About 1 in 5 people have back pain.The cause of low back pain is rarely dangerous. The pain often gets better over time.About half of people with a sudden onset of back pain feel better in just 2 weeks. About 8 in 10 people feel better by 6 weeks.  CAUSES Some common causes of back pain include:  Strain of the muscles or ligaments supporting the spine.  Wear and tear (degeneration) of the spinal discs.  Arthritis.  Direct injury to the back. DIAGNOSIS Most of the time, the direct cause of low back pain is not known.However, back pain can be treated effectively even when the exact cause of the pain is unknown.Answering your caregiver's questions about your overall health and symptoms is one of the most accurate ways to make sure the cause of your pain is not dangerous. If your caregiver needs more information, he or she may order lab work or imaging tests (X-rays or MRIs).However, even if imaging tests show changes in your back, this usually does not require surgery. HOME CARE INSTRUCTIONS For many people, back pain returns.Since low back pain is rarely dangerous, it is often a condition that people can learn to manageon their own.   Remain active. It is stressful on the back to sit or stand in one place. Do not sit, drive, or stand in one place for more than 30 minutes at a time. Take short walks on level surfaces as soon as pain allows.Try to increase the length of time you walk each day.  Do not stay in bed.Resting more than 1 or 2 days can delay your recovery.  Do not avoid exercise or work.Your body is made to move.It is not dangerous to be active, even though your back may hurt.Your back will likely heal faster if you return to being active before your pain is gone.  Pay attention to your body when you bend and lift. Many people have less discomfortwhen lifting if they bend their knees, keep the load close to their bodies,and  avoid twisting. Often, the most comfortable positions are those that put less stress on your recovering back.  Find a comfortable position to sleep. Use a firm mattress and lie on your side with your knees slightly bent. If you lie on your back, put a pillow under your knees.  Only take over-the-counter or prescription medicines as directed by your caregiver. Over-the-counter medicines to reduce pain and inflammation are often the most helpful.Your caregiver may prescribe muscle relaxant drugs.These medicines help dull your pain so you can more quickly return to your normal activities and healthy exercise.  Put ice on the injured area.  Put ice in a plastic bag.  Place a towel between your skin and the bag.  Leave the ice on for 15-20 minutes, 03-04 times a day for the first 2 to 3 days. After that, ice and heat may be alternated to reduce pain and spasms.  Ask your caregiver about trying back exercises and gentle massage. This may be of some benefit.  Avoid feeling anxious or stressed.Stress increases muscle tension and can worsen back pain.It is important to recognize when you are anxious or stressed and learn ways to manage it.Exercise is a great option. SEEK MEDICAL CARE IF:  You have pain that is not relieved with rest or medicine.  You have pain that does not improve in 1 week.  You have new symptoms.  You are generally not feeling well. SEEK   IMMEDIATE MEDICAL CARE IF:   You have pain that radiates from your back into your legs.  You develop new bowel or bladder control problems.  You have unusual weakness or numbness in your arms or legs.  You develop nausea or vomiting.  You develop abdominal pain.  You feel faint. Document Released: 07/01/2005 Document Revised: 12/31/2011 Document Reviewed: 11/02/2013 [employer] Patient Information 2015 [employer]. This information is not intended to replace advice given to you by your health care provider. Make sure you  discuss any questions you have with your health care provider.  

## 2014-03-12 NOTE — ED Provider Notes (Signed)
Medical screening examination/treatment/procedure(s) were performed by non-physician practitioner and as supervising physician I was immediately available for consultation/collaboration.   EKG Interpretation None        Mirian Mo, MD 03/12/14 [phone]

## 2014-07-25 ENCOUNTER — Ambulatory Visit (INDEPENDENT_AMBULATORY_CARE_PROVIDER_SITE_OTHER): Payer: BC Managed Care – PPO | Admitting: Internal Medicine

## 2014-07-25 ENCOUNTER — Other Ambulatory Visit (INDEPENDENT_AMBULATORY_CARE_PROVIDER_SITE_OTHER): Payer: BC Managed Care – PPO

## 2014-07-25 ENCOUNTER — Encounter: Payer: Self-pay | Admitting: Internal Medicine

## 2014-07-25 VITALS — BP 122/76 | HR 89 | Temp 98.8°F | Resp 20 | Ht 65.0 in | Wt 158.0 lb

## 2014-07-25 DIAGNOSIS — B349 Viral infection, unspecified: Secondary | ICD-10-CM

## 2014-07-25 DIAGNOSIS — Z0000 Encounter for general adult medical examination without abnormal findings: Secondary | ICD-10-CM

## 2014-07-25 LAB — BASIC METABOLIC PANEL
BUN: 10 mg/dL (ref 6–23)
CHLORIDE: 99 meq/L (ref 96–112)
CO2: 29 meq/L (ref 19–32)
CREATININE: 1.3 mg/dL (ref 0.4–1.5)
Calcium: 9.4 mg/dL (ref 8.4–10.5)
GFR: 90.73 mL/min (ref >60.00)
GLUCOSE: 106 mg/dL — AB (ref 70–99)
Potassium: 3.5 mEq/L (ref 3.5–5.1)
Sodium: 135 mEq/L (ref 135–145)

## 2014-07-25 NOTE — Progress Notes (Signed)
Pre visit review using our clinic review tool, if applicable. No additional management support is needed unless otherwise documented below in the visit note. 

## 2014-07-25 NOTE — Patient Instructions (Addendum)
You are negative for the flu but we think that you have a virus which is causing your symptoms. Antibiotics do not help viruses. We recommend tylenol or ibuprofen for body aches or fevers. Be sure to stay well hydrated and get plenty of rest.  We will give you a work note to stay off until you are fever free for 24 hours and off tylenol and ibuprofen.  Upper Respiratory Infection, Adult An upper respiratory infection (URI) is also sometimes known as the common cold. The upper respiratory tract includes the nose, sinuses, throat, trachea, and bronchi. Bronchi are the airways leading to the lungs. Most people improve within 1 week, but symptoms can last up to 2 weeks. A residual cough may last even longer.  CAUSES Many different viruses can infect the tissues lining the upper respiratory tract. The tissues become irritated and inflamed and often become very moist. Mucus production is also common. A cold is contagious. You can easily spread the virus to others by oral contact. This includes kissing, sharing a glass, coughing, or sneezing. Touching your mouth or nose and then touching a surface, which is then touched by another person, can also spread the virus. SYMPTOMS  Symptoms typically develop 1 to 3 days after you come in contact with a cold virus. Symptoms vary from person to person. They may include:  Runny nose.  Sneezing.  Nasal congestion.  Sinus irritation.  Sore throat.  Loss of voice (laryngitis).  Cough.  Fatigue.  Muscle aches.  Loss of appetite.  Headache.  Low-grade fever. DIAGNOSIS  You might diagnose your own cold based on familiar symptoms, since most people get a cold 2 to 3 times a year. Your caregiver can confirm this based on your exam. Most importantly, your caregiver can check that your symptoms are not due to another disease such as strep throat, sinusitis, pneumonia, asthma, or epiglottitis. Blood tests, throat tests, and X-rays are not necessary to  diagnose a common cold, but they may sometimes be helpful in excluding other more serious diseases. Your caregiver will decide if any further tests are required. RISKS AND COMPLICATIONS  You may be at risk for a more severe case of the common cold if you smoke cigarettes, have chronic heart disease (such as heart failure) or lung disease (such as asthma), or if you have a weakened immune system. The very young and very old are also at risk for more serious infections. Bacterial sinusitis, middle ear infections, and bacterial pneumonia can complicate the common cold. The common cold can worsen asthma and chronic obstructive pulmonary disease (COPD). Sometimes, these complications can require emergency medical care and may be life-threatening. PREVENTION  The best way to protect against getting a cold is to practice good hygiene. Avoid oral or hand contact with people with cold symptoms. Wash your hands often if contact occurs. There is no clear evidence that vitamin C, vitamin E, echinacea, or exercise reduces the chance of developing a cold. However, it is always recommended to get plenty of rest and practice good nutrition. TREATMENT  Treatment is directed at relieving symptoms. There is no cure. Antibiotics are not effective, because the infection is caused by a virus, not by bacteria. Treatment may include:  Increased fluid intake. Sports drinks offer valuable electrolytes, sugars, and fluids.  Breathing heated mist or steam (vaporizer or shower).  Eating chicken soup or other clear broths, and maintaining good nutrition.  Getting plenty of rest.  Using gargles or lozenges for comfort.  Controlling fevers  with ibuprofen or acetaminophen as directed by your caregiver.  Increasing usage of your inhaler if you have asthma. Zinc gel and zinc lozenges, taken in the first 24 hours of the common cold, can shorten the duration and lessen the severity of symptoms. Pain medicines may help with fever,  muscle aches, and throat pain. A variety of non-prescription medicines are available to treat congestion and runny nose. Your caregiver can make recommendations and may suggest nasal or lung inhalers for other symptoms.  HOME CARE INSTRUCTIONS   Only take over-the-counter or prescription medicines for pain, discomfort, or fever as directed by your caregiver.  Use a warm mist humidifier or inhale steam from a shower to increase air moisture. This may keep secretions moist and make it easier to breathe.  Drink enough water and fluids to keep your urine clear or pale yellow.  Rest as needed.  Return to work when your temperature has returned to normal or as your caregiver advises. You may need to stay home longer to avoid infecting others. You can also use a face mask and careful hand washing to prevent spread of the virus. SEEK MEDICAL CARE IF:   After the first few days, you feel you are getting worse rather than better.  You need your caregiver's advice about medicines to control symptoms.  You develop chills, worsening shortness of breath, or brown or red sputum. These may be signs of pneumonia.  You develop yellow or brown nasal discharge or pain in the face, especially when you bend forward. These may be signs of sinusitis.  You develop a fever, swollen neck glands, pain with swallowing, or white areas in the back of your throat. These may be signs of strep throat. SEEK IMMEDIATE MEDICAL CARE IF:   You have a fever.  You develop severe or persistent headache, ear pain, sinus pain, or chest pain.  You develop wheezing, a prolonged cough, cough up blood, or have a change in your usual mucus (if you have chronic lung disease).  You develop sore muscles or a stiff neck. Document Released: 12/25/2000 Document Revised: 09/23/2011 Document Reviewed: 10/06/2013 [facility] Patient Information 2015 [place], [place]. This information is not intended to replace advice given to you by your health  care provider. Make sure you discuss any questions you have with your health care provider.

## 2014-07-26 ENCOUNTER — Ambulatory Visit: Payer: Self-pay | Admitting: Internal Medicine

## 2014-07-26 DIAGNOSIS — Z0000 Encounter for general adult medical examination without abnormal findings: Secondary | ICD-10-CM | POA: Insufficient documentation

## 2014-07-26 DIAGNOSIS — B349 Viral infection, unspecified: Secondary | ICD-10-CM | POA: Insufficient documentation

## 2014-07-26 LAB — LIPID PANEL
Cholesterol: 138 mg/dL (ref 0–200)
HDL: 53.2 mg/dL (ref >39.00)
LDL Cholesterol: 70 mg/dL (ref 0–99)
NONHDL: 84.8
TRIGLYCERIDES: 72 mg/dL (ref 0.0–149.0)
Total CHOL/HDL Ratio: 3
VLDL: 14.4 mg/dL (ref 0.0–40.0)

## 2014-07-26 NOTE — Assessment & Plan Note (Signed)
POCT for influenza negative. Treatment with hydration, rest, tylenol or ibuprofen. Note for work given to be home until afebrile and off anti-pyretics for 24 hours.

## 2014-07-26 NOTE — Progress Notes (Signed)
   Subjective:    Patient ID: Chris West, male    DOB: 1990/09/04, 24 y.o.   MRN: [id number]  HPI The patient is a 24 YO man who is coming in today to establish care. He is generally healthy but started feeling poorly yesterday. He had fevers last night and is having some muscle aches as well. He stayed home from work today and is not sure if he will be able to make it tomorrow. He has had headaches in the past but hasn't been troubled by them lately. He has been taking tylenol for the fever since yesterday. No chest pains, SOB, cough, congestion. Denies GERD, abdominal pains, joint pains.   Review of Systems  Constitutional: Positive for fever, chills, activity change, appetite change and fatigue. Negative for unexpected weight change.  HENT: Negative for congestion, postnasal drip, rhinorrhea, sinus pressure and sore throat.   Respiratory: Negative for cough, chest tightness, shortness of breath and wheezing.   Cardiovascular: Negative for chest pain, palpitations and leg swelling.  Gastrointestinal: Negative for nausea, abdominal pain, diarrhea, constipation and abdominal distention.  Genitourinary: Negative.   Musculoskeletal: Positive for myalgias. Negative for back pain and arthralgias.  Skin: Negative.   Neurological: Negative.       Objective:   Physical Exam  Constitutional: He is oriented to person, place, and time. He appears well-developed and well-nourished.  HENT:  Head: Normocephalic and atraumatic.  Right Ear: External ear normal.  Left Ear: External ear normal.  Nose: Nose normal.  Mouth/Throat: Oropharynx is clear and moist.  Eyes: EOM are normal.  Neck: Normal range of motion.  Cardiovascular: Normal rate and regular rhythm.   Pulmonary/Chest: Effort normal and breath sounds normal. No respiratory distress. He has no wheezes. He has no rales.  Abdominal: Soft. Bowel sounds are normal. He exhibits no distension. There is no tenderness. There is no rebound.    Musculoskeletal: He exhibits no edema.  Neurological: He is alert and oriented to person, place, and time. Coordination normal.  Skin: Skin is warm and dry.   Filed Vitals:   07/25/14 1403  BP: 122/76  Pulse: 89  Temp: 98.8 F (37.1 C)  TempSrc: Oral  Resp: 20  Height: 5\' 5"  (1.651 m)  Weight: 158 lb (71.668 kg)  SpO2: 98%      Assessment & Plan:

## 2014-07-26 NOTE — Assessment & Plan Note (Signed)
Check lipid panel, BMP.

## 2014-09-12 ENCOUNTER — Ambulatory Visit (INDEPENDENT_AMBULATORY_CARE_PROVIDER_SITE_OTHER): Payer: BC Managed Care – PPO | Admitting: Physician Assistant

## 2014-09-12 VITALS — BP 110/70 | HR 64 | Temp 97.5°F | Resp 16 | Ht 66.0 in | Wt 159.0 lb

## 2014-09-12 DIAGNOSIS — B309 Viral conjunctivitis, unspecified: Secondary | ICD-10-CM

## 2014-09-12 DIAGNOSIS — J069 Acute upper respiratory infection, unspecified: Secondary | ICD-10-CM

## 2014-09-12 MED ORDER — CETIRIZINE-PSEUDOEPHEDRINE ER 5-120 MG PO TB12: 1.0000 | ORAL_TABLET | ORAL | Status: AC

## 2014-09-12 MED ORDER — POLYMYXIN B-TRIMETHOPRIM 10000-0.1 UNIT/ML-% OP SOLN: 1.0000 [drp] | Freq: Four times a day (QID) | OPHTHALMIC | Status: DC

## 2014-09-12 NOTE — Progress Notes (Signed)
09/12/2014 at 1:47 PM  [name] / DOB: 1990/12/23 / MRN: [id number]  The patient has Viral illness and Routine general medical examination at a health care facility on his problem list.  SUBJECTIVE  Chief compalaint: Conjunctivitis   History of present illness: Mr. Berns is 24 y.o. well appearing male presenting for the evaluation of possible right pink eye which he states is mild. This started 1 day ago and is worsening . Associated symptoms include itching, yellow drainage, and eye matting this morning. He also endorses sore throat.  He denies eye trauma, change in vision, HA, history of diabetes and HTN. While nothing aggravates his symptoms, and closing his eyes alleviates his eye symptoms. He has tried nothing. He reports no sick contacts.  He denies a history of seasonal allergies and asthma.    He  has a past medical history of Allergy and Migraine.    He has a current medication list which includes the following prescription(s): cetirizine-pseudoephedrine and trimethoprim-polymyxin b.  Mr. Rogala has No Known Allergies. He  reports that he has never smoked. He has never used smokeless tobacco. He reports that he does not drink alcohol or use illicit drugs. He  reports that he currently engages in sexual activity. He reports using the following method of birth control/protection: Condom. The patient  has past surgical history that includes Fracture surgery.  His family history includes Breast cancer in his maternal grandmother and mother; Healthy in his father, maternal grandfather, paternal grandfather, paternal grandmother, and sister.  ROS  Per HPI  OBJECTIVE  His  height is  (1.676 m) and weight is 159 lb (72.122 kg). His temperature is 97.5 F (36.4 C). His blood pressure is 110/70 and his pulse is 64. His respiration is 16 and oxygen saturation is 99%.  The patient's body mass index is 25.68 kg/(m^2).  Physical Exam  Constitutional: He is oriented to  person, place, and time. He appears well-developed and well-nourished. No distress.  HENT:  Right Ear: Hearing, tympanic membrane, external ear and ear canal normal.  Left Ear: Hearing, tympanic membrane, external ear and ear canal normal.  Nose: Mucosal edema present. No sinus tenderness. Right sinus exhibits no maxillary sinus tenderness and no frontal sinus tenderness. Left sinus exhibits no maxillary sinus tenderness and no frontal sinus tenderness.  Mouth/Throat: Uvula is midline and mucous membranes are normal. Mucous membranes are not pale, not dry and not cyanotic. Posterior oropharyngeal erythema present. No oropharyngeal exudate, posterior oropharyngeal edema or tonsillar abscesses.  Eyes:    Cardiovascular: Normal rate and regular rhythm.   Respiratory: Effort normal and breath sounds normal. He has no wheezes. He has no rales.  Musculoskeletal: Normal range of motion.  Lymphadenopathy:    He has no cervical adenopathy.  Neurological: He is alert and oriented to person, place, and time.  Skin: Skin is warm and dry. He is not diaphoretic.  Psychiatric: He has a normal mood and affect.    No results found for this or any previous visit (from the past 24 hour(s)).   ASSESSMENT & PLAN  Arrington was seen today for conjunctivitis.  Diagnoses and all orders for this visit:  Viral conjunctivitis: Etiology viral vs allergic vs bacterial. Patient advised to try warm compresses for the next three days to alleviate his symptoms.  Should this fail, or should he begin to have purulent drainage, advised he fill the below and take as instructed.  Also advised that he start Zyrtec-D with warm compress to eliminate  an allergic component.   Orders: -     trimethoprim-polymyxin b (POLYTRIM) ophthalmic solution; Place 1 drop into both eyes every 6 (six) hours.  Viral URI Orders: -     cetirizine-pseudoephedrine (ZYRTEC-D ALLERGY & CONGESTION) 5-120 MG per tablet; Take 1 tablet by mouth every  morning. Do not use OTC cold remedies with this medication.    The patient was advised to call or come back to clinic if he does not see an improvement in symptoms, or worsens with the above plan.   Deliah BostonMichael Ameli Sangiovanni, MHS, PA-C [facility] 09/12/2014 1:47 PM

## 2014-09-12 NOTE — Patient Instructions (Signed)

## 2014-10-26 ENCOUNTER — Ambulatory Visit (INDEPENDENT_AMBULATORY_CARE_PROVIDER_SITE_OTHER): Payer: BC Managed Care – PPO | Admitting: Physician Assistant

## 2014-10-26 VITALS — BP 134/52 | HR 100 | Temp 99.6°F | Resp 16 | Ht 66.0 in | Wt 163.0 lb

## 2014-10-26 DIAGNOSIS — J069 Acute upper respiratory infection, unspecified: Secondary | ICD-10-CM

## 2014-10-26 DIAGNOSIS — J029 Acute pharyngitis, unspecified: Secondary | ICD-10-CM | POA: Diagnosis not present

## 2014-10-26 DIAGNOSIS — B9789 Other viral agents as the cause of diseases classified elsewhere: Secondary | ICD-10-CM

## 2014-10-26 LAB — POCT RAPID STREP A (OFFICE): RAPID STREP A SCREEN: NEGATIVE

## 2014-10-26 MED ORDER — IBUPROFEN 600 MG PO TABS: 600.0000 mg | ORAL_TABLET | Freq: Three times a day (TID) | ORAL | Status: DC | PRN

## 2014-10-26 MED ORDER — CETIRIZINE-PSEUDOEPHEDRINE ER 5-120 MG PO TB12: 1.0000 | ORAL_TABLET | ORAL | Status: AC

## 2014-10-26 MED ORDER — HYDROCODONE-HOMATROPINE 5-1.5 MG/5ML PO SYRP: 5.0000 mL | ORAL_SOLUTION | Freq: Three times a day (TID) | ORAL | Status: DC | PRN

## 2014-10-26 NOTE — Progress Notes (Signed)
10/26/2014 at 8:20 PM  Chris West / DOB: [birth year] / MRN: [id number]  The patient  does not have any active problems on file.  SUBJECTIVE  Chief complaint: Sinusitis   History of present illness: Mr. Chris West is 24 y.o. ill appearing male presenting for 3 days of sore throat.  His throat pain is 5/10.  He reports associated cold chills, nasal congestion, sneezing.  He denies eye and ear symptoms, as well as cough.  He has not had the flu shot.   He  has a past medical history of Allergy and Migraine.    He has a current medication list which includes the following prescription(s): cetirizine-pseudoephedrine, hydrocodone-homatropine, and ibuprofen.  Mr. [name] has No Known Allergies. He  reports that he has never smoked. He has never used smokeless tobacco. He reports that he does not drink alcohol or use illicit drugs. He  reports that he currently engages in sexual activity. He reports using the following method of birth control/protection: Condom. The patient  has past surgical history that includes Fracture surgery.  His family history includes Breast cancer in his maternal grandmother and mother; Healthy in his father, maternal grandfather, paternal grandfather, paternal grandmother, and sister.  Review of Systems  Constitutional: Positive for fever and malaise/fatigue. Negative for chills and diaphoresis.  HENT: Positive for congestion and sore throat.   Eyes: Negative for discharge.  Respiratory: Negative for cough and shortness of breath.   Cardiovascular: Negative for chest pain and palpitations.  Gastrointestinal: Negative for heartburn, nausea, vomiting and abdominal pain.  Musculoskeletal: Negative for myalgias.  Skin: Negative.   Neurological: Negative for dizziness, weakness and headaches.    OBJECTIVE  His  height is  (1.676 m) and weight is 163 lb (73.936 kg). His oral temperature is 99.6 F (37.6 C). His blood pressure is 134/52 and his pulse is  100. His respiration is 16 and oxygen saturation is 98%.  The patient's body mass index is 26.32 kg/(m^2).  Physical Exam  Constitutional: He is oriented to person, place, and time. He appears well-developed and well-nourished. No distress.  HENT:  Right Ear: Hearing, tympanic membrane, external ear and ear canal normal.  Left Ear: Hearing, tympanic membrane, external ear and ear canal normal.  Nose: Mucosal edema present. No sinus tenderness. Right sinus exhibits no maxillary sinus tenderness and no frontal sinus tenderness. Left sinus exhibits no maxillary sinus tenderness and no frontal sinus tenderness.  Mouth/Throat: Uvula is midline and mucous membranes are normal. Mucous membranes are not pale, not dry and not cyanotic. Posterior oropharyngeal erythema present. No oropharyngeal exudate, posterior oropharyngeal edema or tonsillar abscesses.  Cardiovascular: Normal rate and regular rhythm.   Respiratory: Effort normal and breath sounds normal. He has no wheezes. He has no rales.  Musculoskeletal: Normal range of motion.  Lymphadenopathy:    He has no cervical adenopathy.  Neurological: He is alert and oriented to person, place, and time.  Skin: Skin is warm and dry. He is not diaphoretic.  Psychiatric: He has a normal mood and affect.    Results for orders placed or performed in visit on 10/26/14 (from the past 24 hour(s))  POCT rapid strep A     Status: None   Collection Time: 10/26/14  7:24 PM  Result Value Ref Range   Rapid Strep A Screen Negative Negative    ASSESSMENT & PLAN  [name] was seen today for sinusitis.  Diagnoses and all orders for this visit:  Sore throat Orders: -  POCT rapid strep A -     Culture, Group A Strep  Viral URI with cough Orders: -     ibuprofen (ADVIL,MOTRIN) 600 MG tablet; Take 1 tablet (600 mg total) by mouth every 8 (eight) hours as needed. -     HYDROcodone-homatropine (HYCODAN) 5-1.5 MG/5ML syrup; Take 5 mLs by mouth every 8 (eight)  hours as needed for cough. -     cetirizine-pseudoephedrine (ZYRTEC-D ALLERGY & CONGESTION) 5-120 MG per tablet; Take 1 tablet by mouth every morning. Do not use OTC cold remedies with this medication.    The patient was advised to call or come back to clinic if he does not see an improvement in symptoms, or worsens with the above plan.   Deliah BostonMichael Clark, MHS, PA-C [facility] 10/26/2014 8:20 PM

## 2014-10-29 LAB — CULTURE, GROUP A STREP: Organism ID, Bacteria: NORMAL

## 2015-10-06 ENCOUNTER — Emergency Department (HOSPITAL_COMMUNITY)
Admission: EM | Admit: 2015-10-06 | Discharge: 2015-10-07 | Disposition: A | Discharge status: Home / Self Care | Payer: BC Managed Care – PPO | Attending: Emergency Medicine | Admitting: Emergency Medicine

## 2015-10-06 ENCOUNTER — Encounter (HOSPITAL_COMMUNITY): Payer: Self-pay | Admitting: Emergency Medicine

## 2015-10-06 DIAGNOSIS — Z8679 Personal history of other diseases of the circulatory system: Secondary | ICD-10-CM | POA: Insufficient documentation

## 2015-10-06 DIAGNOSIS — R109 Unspecified abdominal pain: Secondary | ICD-10-CM

## 2015-10-06 DIAGNOSIS — R197 Diarrhea, unspecified: Secondary | ICD-10-CM | POA: Insufficient documentation

## 2015-10-06 DIAGNOSIS — R1084 Generalized abdominal pain: Secondary | ICD-10-CM | POA: Diagnosis not present

## 2015-10-06 DIAGNOSIS — R112 Nausea with vomiting, unspecified: Secondary | ICD-10-CM | POA: Diagnosis not present

## 2015-10-06 LAB — COMPREHENSIVE METABOLIC PANEL
ALT: 39 U/L (ref 17–63)
AST: 59 U/L — ABNORMAL HIGH (ref 15–41)
Albumin: 4.5 g/dL (ref 3.5–5.0)
Alkaline Phosphatase: 55 U/L (ref 38–126)
Anion gap: 10 (ref 5–15)
BILIRUBIN TOTAL: 1.7 mg/dL — AB (ref 0.3–1.2)
BUN: 14 mg/dL (ref 6–20)
CALCIUM: 9.8 mg/dL (ref 8.9–10.3)
CO2: 24 mmol/L (ref 22–32)
Chloride: 105 mmol/L (ref 101–111)
Creatinine, Ser: 1.55 mg/dL — ABNORMAL HIGH (ref 0.61–1.24)
GFR calc Af Amer: >60 mL/min (ref >60)
Glucose, Bld: 100 mg/dL — ABNORMAL HIGH (ref 65–99)
Potassium: 4.2 mmol/L (ref 3.5–5.1)
Sodium: 139 mmol/L (ref 135–145)
Total Protein: 7.9 g/dL (ref 6.5–8.1)

## 2015-10-06 LAB — URINALYSIS, ROUTINE W REFLEX MICROSCOPIC
BILIRUBIN URINE: NEGATIVE
Glucose, UA: NEGATIVE mg/dL
Ketones, ur: NEGATIVE mg/dL
Leukocytes, UA: NEGATIVE
Nitrite: NEGATIVE
PROTEIN: 30 mg/dL — AB
Specific Gravity, Urine: 1.036 — ABNORMAL HIGH (ref 1.005–1.030)
pH: 5.5 (ref 5.0–8.0)

## 2015-10-06 LAB — LIPASE, BLOOD: Lipase: 26 U/L (ref 11–51)

## 2015-10-06 LAB — CBC
HCT: 46.7 % (ref 39.0–52.0)
Hemoglobin: 15.1 g/dL (ref 13.0–17.0)
MCH: 22.5 pg — AB (ref 26.0–34.0)
MCHC: 32.3 g/dL (ref 30.0–36.0)
MCV: 69.6 fL — ABNORMAL LOW (ref 78.0–100.0)
Platelets: 197 10*3/uL (ref 150–400)
RBC: 6.71 MIL/uL — ABNORMAL HIGH (ref 4.22–5.81)
RDW: 13.9 % (ref 11.5–15.5)
WBC: 4.9 10*3/uL (ref 4.0–10.5)

## 2015-10-06 LAB — URINE MICROSCOPIC-ADD ON

## 2015-10-06 MED ORDER — ONDANSETRON 4 MG PO TBDP
4.0000 mg | ORAL_TABLET | Freq: Once | ORAL | Status: AC | PRN
  Administered 2015-10-06: 4 mg via ORAL

## 2015-10-06 MED ORDER — ONDANSETRON 4 MG PO TBDP
ORAL_TABLET | ORAL | Status: AC
  Filled 2015-10-06: qty 1

## 2015-10-06 NOTE — ED Notes (Signed)
Pt c/o abdominal cramping, n/v/d since yesterday.

## 2015-10-07 MED ORDER — SODIUM CHLORIDE 0.9 % IV BOLUS (SEPSIS)
1000.0000 mL | Freq: Once | INTRAVENOUS | Status: AC
  Administered 2015-10-07: 1000 mL via INTRAVENOUS

## 2015-10-07 MED ORDER — ONDANSETRON 4 MG PO TBDP: ORAL_TABLET | ORAL | Status: DC

## 2015-10-07 NOTE — ED Provider Notes (Signed)
CSN: [id number]     Arrival date & time 10/06/15  2152 History   First MD Initiated Contact with Patient 10/06/15 2311     Chief Complaint  Patient presents with  . Abdominal Cramping  . Emesis  . Diarrhea     (Consider location/radiation/quality/duration/timing/severity/associated sxs/prior Treatment) The history is provided by the patient and medical records. No language interpreter was used.    Chris West is a 25 y.o. male  with a hx of migraine headache presents to the Emergency Department complaining of gradual, persistent, progressively worsening generalized abdominal cramping, nausea, vomiting and diarrhea onset 4 AM this morning. Patient reports that he ate [place] last night but no one that he ate with Sick. He denies known sick contacts however he does work at a gym. Patient reports he's had multiple bouts of nonbloody, nonbilious emesis.  He has also had watery diarrhea. Denies melena or hematochezia.  Patient denies fever, chills, headache, neck pain, neck stiffness, chest pain, shortness of breath, syncope, dysuria, hematuria. He denies recent international travel or recent large amount of alcohol intake. He denies a history of pancreatitis. No treatments prior to arrival. Nothing makes symptoms better or worse. Patient reports feeling some better since he was given Zofran upon arrival in the emergency department.   Past Medical History  Diagnosis Date  . Allergy   . Migraine    Past Surgical History  Procedure Laterality Date  . Fracture surgery     Family History  Problem Relation Age of Onset  . Breast cancer Mother   . Healthy Father   . Healthy Sister   . Breast cancer Maternal Grandmother   . Healthy Maternal Grandfather   . Healthy Paternal Grandmother   . Healthy Paternal Grandfather    Social History  Substance Use Topics  . Smoking status: Never Smoker   . Smokeless tobacco: Never Used  . Alcohol Use: No    Review of Systems  Constitutional:  Negative for fever, diaphoresis, appetite change, fatigue and unexpected weight change.  HENT: Negative for mouth sores.   Eyes: Negative for visual disturbance.  Respiratory: Negative for cough, chest tightness, shortness of breath and wheezing.   Cardiovascular: Negative for chest pain.  Gastrointestinal: Positive for nausea, vomiting, abdominal pain ( generalized, cramping) and diarrhea. Negative for constipation.  Endocrine: Negative for polydipsia, polyphagia and polyuria.  Genitourinary: Negative for dysuria, urgency, frequency and hematuria.  Musculoskeletal: Negative for back pain and neck stiffness.  Skin: Negative for rash.  Allergic/Immunologic: Negative for immunocompromised state.  Neurological: Negative for syncope, light-headedness and headaches.  Hematological: Does not bruise/bleed easily.  Psychiatric/Behavioral: Negative for sleep disturbance. The patient is not nervous/anxious.       Allergies  Review of patient's allergies indicates no known allergies.  Home Medications   Prior to Admission medications   Medication Sig Start Date End Date Taking? Authorizing Provider  ondansetron (ZOFRAN ODT) 4 MG disintegrating tablet 4mg  ODT q4 hours prn nausea/vomit 10/07/15   Yeshaya Vath, PA-C   BP 123/73 mmHg  Pulse 80  Temp(Src) 98.1 F (36.7 C) (Oral)  Resp 18  SpO2 100% Physical Exam  Constitutional: He appears well-developed and well-nourished. No distress.  Awake, alert, nontoxic appearance  HENT:  Head: Normocephalic and atraumatic.  Mouth/Throat: Oropharynx is clear and moist. No oropharyngeal exudate.  Eyes: Conjunctivae are normal. No scleral icterus.  Neck: Normal range of motion. Neck supple.  Cardiovascular: Normal rate, regular rhythm, normal heart sounds and intact distal pulses.   Pulmonary/Chest: Effort  normal and breath sounds normal. No respiratory distress. He has no wheezes.  Equal chest expansion  Abdominal: Soft. Bowel sounds are  normal. He exhibits no distension and no mass. There is no tenderness. There is no rebound and no guarding.  Musculoskeletal: Normal range of motion. He exhibits no edema.  Neurological: He is alert.  Speech is clear and goal oriented Moves extremities without ataxia  Skin: Skin is warm and dry. He is not diaphoretic.  Psychiatric: He has a normal mood and affect.  Nursing note and vitals reviewed.   ED Course  Procedures (including critical care time) Labs Review Labs Reviewed  COMPREHENSIVE METABOLIC PANEL - Abnormal; Notable for the following:    Glucose, Bld 100 (*)    Creatinine, Ser 1.55 (*)    AST 59 (*)    Total Bilirubin 1.7 (*)    All other components within normal limits  CBC - Abnormal; Notable for the following:    RBC 6.71 (*)    MCV 69.6 (*)    MCH 22.5 (*)    All other components within normal limits  URINALYSIS, ROUTINE W REFLEX MICROSCOPIC (NOT AT [facility]) - Abnormal; Notable for the following:    Color, Urine AMBER (*)    APPearance CLOUDY (*)    Specific Gravity, Urine 1.036 (*)    Hgb urine dipstick TRACE (*)    Protein, ur 30 (*)    All other components within normal limits  URINE MICROSCOPIC-ADD ON - Abnormal; Notable for the following:    Squamous Epithelial / LPF 0-5 (*)    Bacteria, UA RARE (*)    All other components within normal limits  LIPASE, BLOOD     1:05 AM Angiocath insertion Performed by: Dierdre Forth  Consent: Verbal consent obtained. Risks and benefits: risks, benefits and alternatives were discussed Time out: Immediately prior to procedure a "time out" was called to verify the correct patient, procedure, equipment, support staff and site/side marked as required.  Preparation: Patient was prepped and draped in the usual sterile fashion.  Vein Location: right forearm  Gauge: 20ga  Normal blood return and flush without difficulty Patient tolerance: Patient tolerated the procedure well with no immediate  complications.     MDM   Final diagnoses:  Nausea vomiting and diarrhea  Abdominal cramping    Chris West presents with generalized abdominal pain, nausea vomiting and diarrhea. Abdomen soft and nontender on arrival. Patient appears dehydrated with dry mucous membranes, increasing creatinine, increased specific gravity and protein in his urine. Patient given fluid bolus.  1:30 AM Patient with symptoms consistent with gastritis.  Likely viral in nature.  Vitals are stable, no fever or tachycardia.  Patient is nontoxic, nonseptic appearing, in no apparent distress.  Patient does not meet the SIRS or Sepsis criteria.  Pt's symptoms have been managed in the department; fluid bolus given.  Tolerating PO fluids > 6 oz.  Lungs are clear.  No focal abdominal pain, no peritoneal signs, no concern for appendicitis, cholecystitis, pancreatitis, ruptured viscus, UTI, kidney stone,  Supportive therapy indicated.  Patient counseled, expresses understanding and agrees with plan.    Dahlia Client Lujean Ebright, PA-C 10/07/15 0130  Layla Maw Ward, DO 10/07/15 1610

## 2015-10-07 NOTE — Discharge Instructions (Signed)
1. Medications: zofran, imodum, usual home medications 2. Treatment: rest, drink plenty of fluids, advance diet slowly 3. Follow Up: Please followup with your primary doctor in 2 days for discussion of your diagnoses and further evaluation after today's visit; if you do not have a primary care doctor use the resource guide provided to find one; Please return to the ER for persistent vomiting, high fevers or worsening symptoms   Viral Gastroenteritis Viral gastroenteritis is also known as stomach flu. This condition affects the stomach and intestinal tract. It can cause sudden diarrhea and vomiting. The illness typically lasts 3 to 8 days. Most people develop an immune response that eventually gets rid of the virus. While this natural response develops, the virus can make you quite ill. CAUSES  Many different viruses can cause gastroenteritis, such as rotavirus or noroviruses. You can catch one of these viruses by consuming contaminated food or water. You may also catch a virus by sharing utensils or other personal items with an infected person or by touching a contaminated surface. SYMPTOMS  The most common symptoms are diarrhea and vomiting. These problems can cause a severe loss of body fluids (dehydration) and a body salt (electrolyte) imbalance. Other symptoms may include:  Fever.  Headache.  Fatigue.  Abdominal pain. DIAGNOSIS  Your caregiver can usually diagnose viral gastroenteritis based on your symptoms and a physical exam. A stool sample may also be taken to test for the presence of viruses or other infections. TREATMENT  This illness typically goes away on its own. Treatments are aimed at rehydration. The most serious cases of viral gastroenteritis involve vomiting so severely that you are not able to keep fluids down. In these cases, fluids must be given through an intravenous line (IV). HOME CARE INSTRUCTIONS   Drink enough fluids to keep your urine clear or pale yellow. Drink  small amounts of fluids frequently and increase the amounts as tolerated.  Ask your caregiver for specific rehydration instructions.  Avoid:  Foods high in sugar.  Alcohol.  Carbonated drinks.  Tobacco.  Juice.  Caffeine drinks.  Extremely hot or cold fluids.  Fatty, greasy foods.  Too much intake of anything at one time.  Dairy products until 24 to 48 hours after diarrhea stops.  You may consume probiotics. Probiotics are active cultures of beneficial bacteria. They may lessen the amount and number of diarrheal stools in adults. Probiotics can be found in yogurt with active cultures and in supplements.  Wash your hands well to avoid spreading the virus.  Only take over-the-counter or prescription medicines for pain, discomfort, or fever as directed by your caregiver. Do not give aspirin to children. Antidiarrheal medicines are not recommended.  Ask your caregiver if you should continue to take your regular prescribed and over-the-counter medicines.  Keep all follow-up appointments as directed by your caregiver. SEEK IMMEDIATE MEDICAL CARE IF:   You are unable to keep fluids down.  You do not urinate at least once every 6 to 8 hours.  You develop shortness of breath.  You notice blood in your stool or vomit. This may look like coffee grounds.  You have abdominal pain that increases or is concentrated in one small area (localized).  You have persistent vomiting or diarrhea.  You have a fever.  The patient is a child younger than 3 months, and he or she has a fever.  The patient is a child older than 3 months, and he or she has a fever and persistent symptoms.  The  patient is a child older than 3 months, and he or she has a fever and symptoms suddenly get worse.  The patient is a baby, and he or she has no tears when crying. MAKE SURE YOU:   Understand these instructions.  Will watch your condition.  Will get help right away if you are not doing well or  get worse.   This information is not intended to replace advice given to you by your health care provider. Make sure you discuss any questions you have with your health care provider.   Document Released: 07/01/2005 Document Revised: 09/23/2011 Document Reviewed: 04/17/2011 Elsevier Interactive Patient Education [employer]

## 2015-10-07 NOTE — ED Notes (Signed)
Pt departed in NAD.  

## 2020-09-12 ENCOUNTER — Other Ambulatory Visit: Payer: Self-pay

## 2020-09-12 ENCOUNTER — Encounter: Payer: Self-pay | Admitting: Internal Medicine

## 2020-09-12 ENCOUNTER — Ambulatory Visit (INDEPENDENT_AMBULATORY_CARE_PROVIDER_SITE_OTHER): Payer: Managed Care, Other (non HMO) | Admitting: Internal Medicine

## 2020-09-12 VITALS — BP 118/72 | HR 66 | Temp 98.7°F | Ht 66.0 in | Wt 178.0 lb

## 2020-09-12 DIAGNOSIS — R8281 Pyuria: Secondary | ICD-10-CM | POA: Insufficient documentation

## 2020-09-12 DIAGNOSIS — K219 Gastro-esophageal reflux disease without esophagitis: Secondary | ICD-10-CM

## 2020-09-12 DIAGNOSIS — R1013 Epigastric pain: Secondary | ICD-10-CM

## 2020-09-12 DIAGNOSIS — Z0000 Encounter for general adult medical examination without abnormal findings: Secondary | ICD-10-CM

## 2020-09-12 DIAGNOSIS — R1030 Lower abdominal pain, unspecified: Secondary | ICD-10-CM | POA: Diagnosis not present

## 2020-09-12 DIAGNOSIS — Z0001 Encounter for general adult medical examination with abnormal findings: Secondary | ICD-10-CM | POA: Insufficient documentation

## 2020-09-12 LAB — CBC WITH DIFFERENTIAL/PLATELET
Basophils Absolute: 0 10*3/uL (ref 0.0–0.1)
Basophils Relative: 0.5 % (ref 0.0–3.0)
Eosinophils Absolute: 0 10*3/uL (ref 0.0–0.7)
Eosinophils Relative: 2 % (ref 0.0–5.0)
HCT: 45.3 % (ref 39.0–52.0)
Hemoglobin: 14.6 g/dL (ref 13.0–17.0)
Lymphocytes Relative: 51.6 % — ABNORMAL HIGH (ref 12.0–46.0)
Lymphs Abs: 1.3 10*3/uL (ref 0.7–4.0)
MCHC: 32.2 g/dL (ref 30.0–36.0)
MCV: 70.8 fl — ABNORMAL LOW (ref 78.0–100.0)
Monocytes Absolute: 0.3 10*3/uL (ref 0.1–1.0)
Monocytes Relative: 12.2 % — ABNORMAL HIGH (ref 3.0–12.0)
Neutro Abs: 0.8 10*3/uL — ABNORMAL LOW (ref 1.4–7.7)
Neutrophils Relative %: 33.7 % — ABNORMAL LOW (ref 43.0–77.0)
Platelets: 185 10*3/uL (ref 150.0–400.0)
RBC: 6.39 Mil/uL — ABNORMAL HIGH (ref 4.22–5.81)
RDW: 14.7 % (ref 11.5–15.5)
WBC: 2.5 10*3/uL — ABNORMAL LOW (ref 4.0–10.5)

## 2020-09-12 LAB — LIPID PANEL
Cholesterol: 147 mg/dL (ref 0–200)
HDL: 51 mg/dL (ref >39.00)
LDL Cholesterol: 85 mg/dL (ref 0–99)
NonHDL: 96.31
Total CHOL/HDL Ratio: 3
Triglycerides: 56 mg/dL (ref 0.0–149.0)
VLDL: 11.2 mg/dL (ref 0.0–40.0)

## 2020-09-12 LAB — URINALYSIS, ROUTINE W REFLEX MICROSCOPIC
Bilirubin Urine: NEGATIVE
Ketones, ur: NEGATIVE
Leukocytes,Ua: NEGATIVE
Nitrite: NEGATIVE
Specific Gravity, Urine: >=1.03 — AB (ref 1.000–1.030)
Total Protein, Urine: NEGATIVE
Urine Glucose: NEGATIVE
Urobilinogen, UA: 0.2 (ref 0.0–1.0)
pH: 6 (ref 5.0–8.0)

## 2020-09-12 LAB — BASIC METABOLIC PANEL
BUN: 14 mg/dL (ref 6–23)
CO2: 30 mEq/L (ref 19–32)
Calcium: 9.9 mg/dL (ref 8.4–10.5)
Chloride: 103 mEq/L (ref 96–112)
Creatinine, Ser: 1.42 mg/dL (ref 0.40–1.50)
GFR: 67.62 mL/min (ref >60.00)
Glucose, Bld: 91 mg/dL (ref 70–99)
Potassium: 4.1 mEq/L (ref 3.5–5.1)
Sodium: 139 mEq/L (ref 135–145)

## 2020-09-12 LAB — HEPATIC FUNCTION PANEL
ALT: 15 U/L (ref 0–53)
AST: 23 U/L (ref 0–37)
Albumin: 4.7 g/dL (ref 3.5–5.2)
Alkaline Phosphatase: 47 U/L (ref 39–117)
Bilirubin, Direct: 0.2 mg/dL (ref 0.0–0.3)
Total Bilirubin: 0.9 mg/dL (ref 0.2–1.2)
Total Protein: 7.9 g/dL (ref 6.0–8.3)

## 2020-09-12 MED ORDER — ESOMEPRAZOLE MAGNESIUM 40 MG PO CPDR: 40.0000 mg | DELAYED_RELEASE_CAPSULE | Freq: Every day | ORAL | 1 refills | Status: DC

## 2020-09-12 NOTE — Patient Instructions (Signed)

## 2020-09-12 NOTE — Progress Notes (Signed)
Subjective:  Patient ID: Chris West, male    DOB: [birth year]  Age: 30 y.o. MRN: [id number]  CC: Abdominal Pain, Annual Exam, and Gastroesophageal Reflux  This visit occurred during the SARS-CoV-2 public health emergency.  Safety protocols were in place, including screening questions prior to the visit, additional usage of staff PPE, and extensive cleaning of exam room while observing appropriate contact time as indicated for disinfecting solutions.    HPI Chris West presents for a CPX and to establish.  He complains of a 4-year history of chronic, intermittent abdominal pain that he describes as a brief, sharp sensation in both lower quadrants of his abdomen. He tells me the symptoms are exacerbated by spicy foods. He also complains of heartburn and belching. He gets symptom relief with ginger ale. He also tells me that spicy food causes anal irritation. He denies loss of appetite, weight loss, diarrhea, constipation, bright red blood per rectum, or melena. He tells me that a year ago he was treated for urethral trichomonas. He denies dysuria, hematuria, or lymphadenopathy.  History Chris West has a past medical history of Allergy and Migraine.   He has a past surgical history that includes Fracture surgery.   His family history includes Breast cancer in his maternal grandmother and mother; Healthy in his father, maternal grandfather, paternal grandfather, paternal grandmother, and sister; Prostate cancer in his father.He reports that he has never smoked. He has never used smokeless tobacco. He reports current alcohol use of about 4.0 standard drinks of alcohol per week. He reports current drug use. Drug: Marijuana.  Outpatient Medications Prior to Visit  Medication Sig Dispense Refill  . ondansetron (ZOFRAN ODT) 4 MG disintegrating tablet 4mg  ODT q4 hours prn nausea/vomit 4 tablet 0   No facility-administered medications prior to visit.    ROS Review of Systems   Constitutional: Negative for appetite change, diaphoresis, fatigue and unexpected weight change.  HENT: Negative.  Negative for trouble swallowing.   Eyes: Negative.   Respiratory: Negative for cough, chest tightness, shortness of breath and wheezing.   Cardiovascular: Negative for chest pain, palpitations and leg swelling.  Gastrointestinal: Positive for abdominal pain. Negative for abdominal distention, anal bleeding, blood in stool, constipation, diarrhea, nausea and vomiting.  Endocrine: Negative.   Genitourinary: Negative.  Negative for difficulty urinating, dysuria, frequency, hematuria, penile discharge, penile swelling, scrotal swelling, testicular pain and urgency.  Musculoskeletal: Negative for arthralgias and myalgias.  Skin: Negative.  Negative for color change and pallor.  Neurological: Negative.  Negative for dizziness, weakness, light-headedness and headaches.  Hematological: Negative for adenopathy. Does not bruise/bleed easily.  Psychiatric/Behavioral: Negative.     Objective:  BP 118/72   Pulse 66   Temp 98.7 F (37.1 C) (Oral)   Ht 5\' 6"  (1.676 m)   Wt 178 lb (80.7 kg)   SpO2 98%   BMI 28.73 kg/m   Physical Exam Vitals reviewed.  Constitutional:      Appearance: He is well-developed.  HENT:     Mouth/Throat:     Mouth: Mucous membranes are moist.  Eyes:     General: No scleral icterus.    Extraocular Movements: Extraocular movements intact.  Cardiovascular:     Rate and Rhythm: Normal rate and regular rhythm.     Heart sounds: No murmur heard.   Pulmonary:     Effort: Pulmonary effort is normal.     Breath sounds: No stridor. No wheezing, rhonchi or rales.  Abdominal:     General: Abdomen is flat. Bowel  sounds are normal. There is no distension.     Palpations: Abdomen is soft. There is no hepatomegaly, splenomegaly or mass.     Tenderness: There is no abdominal tenderness.     Hernia: No hernia is present. There is no hernia in the left inguinal  area or right inguinal area.  Genitourinary:    Pubic Area: No rash.      Penis: Normal and circumcised. No discharge, swelling or lesions.      Testes: Normal.        Right: Mass, tenderness or swelling not present.        Left: Mass, tenderness or swelling not present.     Epididymis:     Right: Normal. Not enlarged. No mass.     Left: Normal. Not enlarged. No mass.     Prostate: Normal. Not enlarged, not tender and no nodules present.     Rectum: Guaiac result negative. Internal hemorrhoid present. No mass, tenderness, anal fissure or external hemorrhoid. Normal anal tone.     Comments: Uncomplicated internal anal hemorrhoids Musculoskeletal:     Cervical back: Neck supple.  Lymphadenopathy:     Cervical: No cervical adenopathy.     Lower Body: No right inguinal adenopathy. No left inguinal adenopathy.  Neurological:     Mental Status: He is alert.     Lab Results  Component Value Date   WBC 2.5 (L) 09/12/2020   HGB 14.6 09/12/2020   HCT 45.3 09/12/2020   PLT 185.0 09/12/2020   GLUCOSE 91 09/12/2020   CHOL 147 09/12/2020   TRIG 56.0 09/12/2020   HDL 51.00 09/12/2020   LDLCALC 85 09/12/2020   ALT 15 09/12/2020   AST 23 09/12/2020   NA 139 09/12/2020   K 4.1 09/12/2020   CL 103 09/12/2020   CREATININE 1.42 09/12/2020   BUN 14 09/12/2020   CO2 30 09/12/2020     Assessment & Plan:   Chris West was seen today for abdominal pain, annual exam and gastroesophageal reflux.  Diagnoses and all orders for this visit:  Encounter for general adult medical examination with abnormal findings- Exam completed, labs reviewed, vaccines reviewed - He refused a flu vaccine, patient education material was given. -     Lipid panel; Future -     Hepatitis C antibody; Future -     HIV Antibody (routine testing w rflx); Future -     HIV Antibody (routine testing w rflx) -     Hepatitis C antibody -     Lipid panel  Lower abdominal pain- He has chronic, benign abdominal pain. His labs  are reassuring. Will treat him for dyspepsia and GERD. -     CBC with Differential/Platelet; Future -     Basic metabolic panel; Future -     Urinalysis, Routine w reflex microscopic; Future -     Hepatic function panel; Future -     Hepatic function panel -     Urinalysis, Routine w reflex microscopic -     Basic metabolic panel -     CBC with Differential/Platelet  Dyspepsia -     CBC with Differential/Platelet; Future -     CBC with Differential/Platelet  Gastroesophageal reflux disease without esophagitis -     esomeprazole (NEXIUM) 40 MG capsule; Take 1 capsule (40 mg total) by mouth daily.  Pyuria- Screenings for infection are all negative. If he has symptoms with this then I will treat him for NGU. -     Chlamydia/Gonococcus/Trichomonas, NAA;  Future -     CULTURE, URINE COMPREHENSIVE; Future   I have discontinued Chris West's ondansetron. I am also having him start on esomeprazole.  Meds ordered this encounter  Medications  . esomeprazole (NEXIUM) 40 MG capsule    Sig: Take 1 capsule (40 mg total) by mouth daily.    Dispense:  90 capsule    Refill:  1     Follow-up: Return in about 3 months (around 12/13/2020).  Sanda Linger, MD

## 2020-09-13 ENCOUNTER — Other Ambulatory Visit: Payer: Managed Care, Other (non HMO)

## 2020-09-13 DIAGNOSIS — R8281 Pyuria: Secondary | ICD-10-CM

## 2020-09-13 LAB — HEPATITIS C ANTIBODY
Hepatitis C Ab: NONREACTIVE
SIGNAL TO CUT-OFF: 0.01 (ref <1.00)

## 2020-09-13 LAB — HIV ANTIBODY (ROUTINE TESTING W REFLEX): HIV 1&2 Ab, 4th Generation: NONREACTIVE

## 2020-09-15 LAB — CHLAMYDIA/GONOCOCCUS/TRICHOMONAS, NAA
Chlamydia by NAA: NEGATIVE
Gonococcus by NAA: NEGATIVE
Trich vag by NAA: NEGATIVE

## 2020-09-15 LAB — CULTURE, URINE COMPREHENSIVE: RESULT:: NO GROWTH

## 2021-03-14 ENCOUNTER — Other Ambulatory Visit: Payer: Self-pay

## 2021-03-14 ENCOUNTER — Encounter: Payer: Self-pay | Admitting: Internal Medicine

## 2021-03-14 ENCOUNTER — Ambulatory Visit (INDEPENDENT_AMBULATORY_CARE_PROVIDER_SITE_OTHER): Payer: Managed Care, Other (non HMO) | Admitting: Internal Medicine

## 2021-03-14 VITALS — BP 122/80 | HR 64 | Ht 66.0 in | Wt 186.0 lb

## 2021-03-14 DIAGNOSIS — F419 Anxiety disorder, unspecified: Secondary | ICD-10-CM

## 2021-03-14 DIAGNOSIS — R1013 Epigastric pain: Secondary | ICD-10-CM | POA: Diagnosis not present

## 2021-03-14 DIAGNOSIS — K219 Gastro-esophageal reflux disease without esophagitis: Secondary | ICD-10-CM | POA: Diagnosis not present

## 2021-03-14 LAB — CBC WITH DIFFERENTIAL/PLATELET
Basophils Absolute: 0 10*3/uL (ref 0.0–0.1)
Basophils Relative: 0.5 % (ref 0.0–3.0)
Eosinophils Absolute: 0.1 10*3/uL (ref 0.0–0.7)
Eosinophils Relative: 2.1 % (ref 0.0–5.0)
HCT: 40.9 % (ref 39.0–52.0)
Hemoglobin: 13 g/dL (ref 13.0–17.0)
Lymphocytes Relative: 53.7 % — ABNORMAL HIGH (ref 12.0–46.0)
Lymphs Abs: 1.6 10*3/uL (ref 0.7–4.0)
MCHC: 31.7 g/dL (ref 30.0–36.0)
MCV: 71.4 fl — ABNORMAL LOW (ref 78.0–100.0)
Monocytes Absolute: 0.3 10*3/uL (ref 0.1–1.0)
Monocytes Relative: 10.4 % (ref 3.0–12.0)
Neutro Abs: 1 10*3/uL — ABNORMAL LOW (ref 1.4–7.7)
Neutrophils Relative %: 33.3 % — ABNORMAL LOW (ref 43.0–77.0)
Platelets: 159 10*3/uL (ref 150.0–400.0)
RBC: 5.73 Mil/uL (ref 4.22–5.81)
RDW: 14.4 % (ref 11.5–15.5)
WBC: 3 10*3/uL — ABNORMAL LOW (ref 4.0–10.5)

## 2021-03-14 LAB — HEPATIC FUNCTION PANEL
ALT: 17 U/L (ref 0–53)
AST: 22 U/L (ref 0–37)
Albumin: 4.3 g/dL (ref 3.5–5.2)
Alkaline Phosphatase: 43 U/L (ref 39–117)
Bilirubin, Direct: 0.2 mg/dL (ref 0.0–0.3)
Total Bilirubin: 0.7 mg/dL (ref 0.2–1.2)
Total Protein: 7.3 g/dL (ref 6.0–8.3)

## 2021-03-14 LAB — BASIC METABOLIC PANEL
BUN: 15 mg/dL (ref 6–23)
CO2: 30 mEq/L (ref 19–32)
Calcium: 9.4 mg/dL (ref 8.4–10.5)
Chloride: 104 mEq/L (ref 96–112)
Creatinine, Ser: 1.25 mg/dL (ref 0.40–1.50)
GFR: 78.52 mL/min (ref >60.00)
Glucose, Bld: 93 mg/dL (ref 70–99)
Potassium: 4 mEq/L (ref 3.5–5.1)
Sodium: 139 mEq/L (ref 135–145)

## 2021-03-14 LAB — LIPASE: Lipase: 27 U/L (ref 11.0–59.0)

## 2021-03-14 LAB — H. PYLORI ANTIBODY, IGG: H Pylori IgG: NEGATIVE

## 2021-03-14 MED ORDER — SUCRALFATE 1 G PO TABS: 1.0000 g | ORAL_TABLET | Freq: Four times a day (QID) | ORAL | 0 refills | Status: DC

## 2021-03-14 MED ORDER — PANTOPRAZOLE SODIUM 40 MG PO TBEC: 40.0000 mg | DELAYED_RELEASE_TABLET | Freq: Every day | ORAL | 0 refills | Status: DC

## 2021-03-14 NOTE — Progress Notes (Signed)
Patient ID: Chris West, male   DOB: 1990-11-18, 30 y.o.   MRN: [id number]        Chief Complaint: follow up upper abd pain       HPI:  Chris West is a 30 y.o. male here with upper abd pain x 6 mo, upper mid abd with some radiation to the upper left and right, some nausea, no vomiting, worse with tomato and garlic and spicy foods but not really better overall with completely stopping this, incidentally pain yesterday was ok but prior to that has been mod to severe intermittent; Denies worsening reflux, dysphagia, bowel change or blood.  No wt loss, fever, chills.  Usually has up to 4 BM per day, no down to 2 per day.  No prior hx of same, has no prior egd or colonoscopy.  Pt denies chest pain, increased sob or doe, wheezing, orthopnea, PND, increased LE swelling, palpitations, dizziness or syncope.   Pt denies polydipsia, polyuria, or new focal neuro s/s.       Mild nervous today - Denies worsening depressive symptoms, suicidal ideation, or panic; Wt Readings from Last 3 Encounters:  03/14/21 186 lb (84.4 kg)  09/12/20 178 lb (80.7 kg)  10/26/14 163 lb (73.9 kg)   BP Readings from Last 3 Encounters:  03/14/21 122/80  09/12/20 118/72  10/07/15 136/80         Past Medical History:  Diagnosis Date   Allergy    Migraine    Past Surgical History:  Procedure Laterality Date   FRACTURE SURGERY      reports that he has never smoked. He has never used smokeless tobacco. He reports current alcohol use of about 4.0 standard drinks per week. He reports current drug use. Drug: Marijuana. family history includes Breast cancer in his maternal grandmother and mother; Healthy in his father, maternal grandfather, paternal grandfather, paternal grandmother, and sister; Prostate cancer in his father. No Known Allergies No current outpatient medications on file prior to visit.   No current facility-administered medications on file prior to visit.        ROS:  All others reviewed and  negative.  Objective        PE:  BP 122/80 (BP Location: Right Arm, Patient Position: Sitting, Cuff Size: Large)   Pulse 64   Ht 5\' 6"  (1.676 m)   Wt 186 lb (84.4 kg)   SpO2 99%   BMI 30.02 kg/m                 Constitutional: Pt appears in NAD               HENT: Head: NCAT.                Right Ear: External ear normal.                 Left Ear: External ear normal.                Eyes: . Pupils are equal, round, and reactive to light. Conjunctivae and EOM are normal               Nose: without d/c or deformity               Neck: Neck supple. Gross normal ROM               Cardiovascular: Normal rate and regular rhythm.  Pulmonary/Chest: Effort normal and breath sounds without rales or wheezing.                Abd:  Soft, NT, ND, + BS, no organomegaly               Neurological: Pt is alert. At baseline orientation, motor grossly intact               Skin: Skin is warm. No rashes, no other new lesions, LE edema - none               Psychiatric: Pt behavior is normal without agitation   Micro: none  Cardiac tracings I have personally interpreted today:  none  Pertinent Radiological findings (summarize): none   Lab Results  Component Value Date   WBC 3.0 (L) 03/14/2021   HGB 13.0 03/14/2021   HCT 40.9 03/14/2021   PLT 159.0 03/14/2021   GLUCOSE 93 03/14/2021   CHOL 147 09/12/2020   TRIG 56.0 09/12/2020   HDL 51.00 09/12/2020   LDLCALC 85 09/12/2020   ALT 17 03/14/2021   AST 22 03/14/2021   NA 139 03/14/2021   K 4.0 03/14/2021   CL 104 03/14/2021   CREATININE 1.25 03/14/2021   BUN 15 03/14/2021   CO2 30 03/14/2021   Assessment/Plan:  Chris West is a 30 y.o. Black or African American [2] male with  has a past medical history of Allergy and Migraine.  Gastroesophageal reflux disease without esophagitis For PPI as above  Epigastric pain C/w probable gastritis vs PUD, for protonix, carafate, check labs with h pylori, check abd u/s and refer  GI per pt request,  to f/u any worsening symptoms or concerns  Anxiety Mild situational, declines change in tx today  Followup: Return if symptoms worsen or fail to improve.  Oliver Barre, MD 03/18/2021 2:55 PM [facility]

## 2021-03-14 NOTE — Patient Instructions (Signed)
Please take all new medication as prescribed - the protonix and carafate for 1 month  Please avoid advil and alleve type medications, and any high fat, spicy, garlic or tomato foods  Please continue all other medications as before, and refills have been done if requested.  Please have the pharmacy call with any other refills you may need.  Please continue your efforts at being more active, low cholesterol diet, and weight control.  Please keep your appointments with your specialists as you may have planned  You will be contacted regarding the referral for: abdomen ultrasound  .You will be contacted regarding the referral for: Gastroenterology  Please go to the LAB at the blood drawing area for the tests to be done  You will be contacted by phone if any changes need to be made immediately.  Otherwise, you will receive a letter about your results with an explanation, but please check with MyChart first.  Please remember to sign up for MyChart if you have not done so, as this will be important to you in the future with finding out test results, communicating by private email, and scheduling acute appointments online when needed.

## 2021-03-15 ENCOUNTER — Encounter: Payer: Self-pay | Admitting: Internal Medicine

## 2021-03-15 LAB — URINALYSIS, ROUTINE W REFLEX MICROSCOPIC
Bilirubin Urine: NEGATIVE
Hgb urine dipstick: NEGATIVE
Ketones, ur: NEGATIVE
Nitrite: NEGATIVE
RBC / HPF: NONE SEEN (ref 0–?)
Specific Gravity, Urine: >=1.03 — AB (ref 1.000–1.030)
Total Protein, Urine: NEGATIVE
Urine Glucose: NEGATIVE
Urobilinogen, UA: 0.2 (ref 0.0–1.0)
pH: 6 (ref 5.0–8.0)

## 2021-03-18 ENCOUNTER — Encounter: Payer: Self-pay | Admitting: Internal Medicine

## 2021-03-18 DIAGNOSIS — F419 Anxiety disorder, unspecified: Secondary | ICD-10-CM | POA: Insufficient documentation

## 2021-03-18 NOTE — Assessment & Plan Note (Signed)
C/w probable gastritis vs PUD, for protonix, carafate, check labs with h pylori, check abd u/s and refer GI per pt request,  to f/u any worsening symptoms or concerns

## 2021-03-18 NOTE — Assessment & Plan Note (Signed)
For PPI as above

## 2021-03-18 NOTE — Assessment & Plan Note (Addendum)
Mild situational, declines change in tx today

## 2021-03-28 ENCOUNTER — Ambulatory Visit
Admission: RE | Admit: 2021-03-28 | Discharge: 2021-03-28 | Disposition: A | Discharge status: Home / Self Care | Payer: Managed Care, Other (non HMO) | Source: Ambulatory Visit | Attending: Internal Medicine | Admitting: Internal Medicine

## 2021-03-28 DIAGNOSIS — K219 Gastro-esophageal reflux disease without esophagitis: Secondary | ICD-10-CM

## 2021-03-28 DIAGNOSIS — R1013 Epigastric pain: Secondary | ICD-10-CM

## 2021-03-29 ENCOUNTER — Encounter: Payer: Self-pay | Admitting: Internal Medicine

## 2021-03-30 MED ORDER — PANTOPRAZOLE SODIUM 40 MG PO TBEC: 40.0000 mg | DELAYED_RELEASE_TABLET | Freq: Every day | ORAL | 0 refills | Status: DC

## 2021-03-30 MED ORDER — SUCRALFATE 1 G PO TABS: 1.0000 g | ORAL_TABLET | Freq: Four times a day (QID) | ORAL | 0 refills | Status: AC

## 2021-05-16 ENCOUNTER — Ambulatory Visit (HOSPITAL_COMMUNITY)
Admission: EM | Admit: 2021-05-16 | Discharge: 2021-05-16 | Disposition: A | Discharge status: Home / Self Care | Payer: Managed Care, Other (non HMO) | Attending: Emergency Medicine | Admitting: Emergency Medicine

## 2021-05-16 ENCOUNTER — Encounter (HOSPITAL_COMMUNITY): Payer: Self-pay | Admitting: Emergency Medicine

## 2021-05-16 ENCOUNTER — Other Ambulatory Visit: Payer: Self-pay

## 2021-05-16 DIAGNOSIS — N390 Urinary tract infection, site not specified: Secondary | ICD-10-CM | POA: Diagnosis not present

## 2021-05-16 LAB — POCT URINALYSIS DIPSTICK, ED / UC
Bilirubin Urine: NEGATIVE
Glucose, UA: NEGATIVE mg/dL
Hgb urine dipstick: NEGATIVE
Ketones, ur: NEGATIVE mg/dL
Nitrite: NEGATIVE
Protein, ur: NEGATIVE mg/dL
Specific Gravity, Urine: <=1.005 (ref 1.005–1.030)
Urobilinogen, UA: 0.2 mg/dL (ref 0.0–1.0)
pH: 5.5 (ref 5.0–8.0)

## 2021-05-16 MED ORDER — CEPHALEXIN 500 MG PO CAPS: 500.0000 mg | ORAL_CAPSULE | Freq: Two times a day (BID) | ORAL | 0 refills | Status: DC

## 2021-05-16 NOTE — ED Triage Notes (Signed)
Pt had covid last Wed. Reports was laying in bed a lot. Reports lower back pains and pain is worse when takes deep breaths in. Reports that he is urinating more now. Denies dysuria or hematuria or trouble with bowels.

## 2021-05-16 NOTE — ED Provider Notes (Signed)
[place]    CSN: [id number] Arrival date & time: 05/16/21  [id number]      History   Chief Complaint Chief Complaint  Patient presents with   Back Pain    HPI Chris West is a 30 y.o. male.   Patient here for evaluation of lower back pain that has been ongoing for the past week.  Reports diagnosed with COVID last Wednesday.  Reports pain is worse with deep breaths and gets progressively worse throughout the day.  Also reports urinating more frequently.  Denies any dysuria, hematuria, or diarrhea/constipation.  Has not taken any OTC medications or treatments.  Denies any trauma, injury, or other precipitating event.  Denies any specific alleviating or aggravating factors.  Denies any fevers, chest pain, shortness of breath, N/V/D, numbness, tingling, weakness, abdominal pain, or headaches.    The history is provided by the patient.  Back Pain  Past Medical History:  Diagnosis Date   Allergy    Migraine     Patient Active Problem List   Diagnosis Date Noted   Anxiety 03/18/2021   Epigastric pain 03/14/2021   Encounter for general adult medical examination with abnormal findings 09/12/2020   Lower abdominal pain 09/12/2020   Dyspepsia 09/12/2020   Gastroesophageal reflux disease without esophagitis 09/12/2020   Pyuria 09/12/2020    Past Surgical History:  Procedure Laterality Date   FRACTURE SURGERY         Home Medications    Prior to Admission medications   Medication Sig Start Date End Date Taking? Authorizing Provider  cephALEXin (KEFLEX) 500 MG capsule Take 1 capsule (500 mg total) by mouth 2 (two) times daily for 7 days. 05/16/21 05/23/21 Yes Pearson Forster, NP  pantoprazole (PROTONIX) 40 MG tablet Take 1 tablet (40 mg total) by mouth daily. 03/30/21   Biagio Borg, MD  sucralfate (CARAFATE) 1 g tablet Take 1 tablet (1 g total) by mouth 4 (four) times daily. 03/30/21   Biagio Borg, MD    Family History Family History  Problem Relation Age  of Onset   Breast cancer Mother    Healthy Father    Prostate cancer Father    Healthy Sister    Breast cancer Maternal Grandmother    Healthy Maternal Grandfather    Healthy Paternal Grandmother    Healthy Paternal Grandfather     Social History Social History   Tobacco Use   Smoking status: Never   Smokeless tobacco: Never  Substance Use Topics   Alcohol use: Yes    Alcohol/week: 4.0 standard drinks    Types: 2 Cans of beer, 2 Shots of liquor per week   Drug use: Yes    Types: Marijuana     Allergies   Patient has no known allergies.   Review of Systems Review of Systems  Genitourinary:  Positive for flank pain and frequency. Negative for hematuria and urgency.  Musculoskeletal:  Positive for back pain.  All other systems reviewed and are negative.   Physical Exam Triage Vital Signs ED Triage Vitals  Enc Vitals Group     BP 05/16/21 0952 135/85     Pulse Rate 05/16/21 0952 (!) 55     Resp 05/16/21 0952 15     Temp 05/16/21 0952 98 F (36.7 C)     Temp Source 05/16/21 0952 Oral     SpO2 05/16/21 0952 96 %     Weight --      Height --  Head Circumference --      Peak Flow --      Pain Score 05/16/21 0950 7     Pain Loc --      Pain Edu? --      Excl. in GC? --    No data found.  Updated Vital Signs BP 135/85 (BP Location: Right Arm)   Pulse (!) 55   Temp 98 F (36.7 C) (Oral)   Resp 15   SpO2 96%   Visual Acuity Right Eye Distance:   Left Eye Distance:   Bilateral Distance:    Right Eye Near:   Left Eye Near:    Bilateral Near:     Physical Exam Vitals and nursing note reviewed.  Constitutional:      General: He is not in acute distress.    Appearance: Normal appearance. He is not ill-appearing, toxic-appearing or diaphoretic.  HENT:     Head: Normocephalic and atraumatic.  Eyes:     Conjunctiva/sclera: Conjunctivae normal.  Cardiovascular:     Rate and Rhythm: Normal rate.     Pulses: Normal pulses.     Heart sounds: Normal  heart sounds.  Pulmonary:     Effort: Pulmonary effort is normal.     Breath sounds: Normal breath sounds.  Abdominal:     General: Abdomen is flat.     Tenderness: There is no right CVA tenderness or left CVA tenderness.  Musculoskeletal:        General: Normal range of motion.     Cervical back: Normal range of motion.  Skin:    General: Skin is warm and dry.  Neurological:     General: No focal deficit present.     Mental Status: He is alert and oriented to person, place, and time.  Psychiatric:        Mood and Affect: Mood normal.     UC Treatments / Results  Labs (all labs ordered are listed, but only abnormal results are displayed) Labs Reviewed  POCT URINALYSIS DIPSTICK, ED / UC - Abnormal; Notable for the following components:      Result Value   Leukocytes,Ua TRACE (*)    All other components within normal limits  URINE CULTURE    EKG   Radiology No results found.  Procedures Procedures (including critical care time)  Medications Ordered in UC Medications - No data to display  Initial Impression / Assessment and Plan / UC Course  I have reviewed the triage vital signs and the nursing notes.  Pertinent labs & imaging results that were available during my care of the patient were reviewed by me and considered in my medical decision making (see chart for details).    Assessment negative for red flags or concerns.  Urinalysis positive for leukocytes but otherwise negative.  With associated symptoms concern is for urinary tract infection.  We will go ahead and treat with Keflex twice daily for the next 7 days.  If urine culture is negative patient may stop antibiotics.  Encourage fluids.  May use heat, ice, and OTC pain relievers as needed for back pain.  Follow-up as needed. Final Clinical Impressions(s) / UC Diagnoses   Final diagnoses:  Lower urinary tract infectious disease     Discharge Instructions      Take the Keflex twice a day for the next 7  days.    You can take Tylenol and/or Ibuprofen as needed for pain relief and fever reduction.   You can use use heat, ice,  or alternate between heat and ice for comfort.  You can use IcyHot, lidocaine patches, Biofreeze, Aspercreme, or Voltaren gel as needed for pain relief.   Make sure you are drinking plenty of fluids, especially water.  You can drink cranberry juice to help with symptom relief, but make sure it is cranberry juice and not cranberry cocktail.  You can also try AZO, cranberry pills, or pyridium as needed.    Return or go to the Emergency Department if symptoms worsen or do not improve in the next few days.      ED Prescriptions     Medication Sig Dispense Auth. Provider   cephALEXin (KEFLEX) 500 MG capsule Take 1 capsule (500 mg total) by mouth 2 (two) times daily for 7 days. 14 capsule Pearson Forster, NP      PDMP not reviewed this encounter.   Pearson Forster, NP 05/16/21 1029

## 2021-05-16 NOTE — Discharge Instructions (Signed)
Take the Keflex twice a day for the next 7 days.    You can take Tylenol and/or Ibuprofen as needed for pain relief and fever reduction.   You can use use heat, ice, or alternate between heat and ice for comfort.  You can use IcyHot, lidocaine patches, Biofreeze, Aspercreme, or Voltaren gel as needed for pain relief.   Make sure you are drinking plenty of fluids, especially water.  You can drink cranberry juice to help with symptom relief, but make sure it is cranberry juice and not cranberry cocktail.  You can also try AZO, cranberry pills, or pyridium as needed.    Return or go to the Emergency Department if symptoms worsen or do not improve in the next few days.

## 2021-05-17 LAB — URINE CULTURE: Culture: NO GROWTH

## 2021-05-22 ENCOUNTER — Ambulatory Visit (INDEPENDENT_AMBULATORY_CARE_PROVIDER_SITE_OTHER): Payer: Managed Care, Other (non HMO) | Admitting: Internal Medicine

## 2021-05-22 ENCOUNTER — Encounter: Payer: Self-pay | Admitting: Internal Medicine

## 2021-05-22 VITALS — BP 122/90 | HR 72 | Ht 66.0 in | Wt 184.0 lb

## 2021-05-22 DIAGNOSIS — K219 Gastro-esophageal reflux disease without esophagitis: Secondary | ICD-10-CM | POA: Diagnosis not present

## 2021-05-22 DIAGNOSIS — R109 Unspecified abdominal pain: Secondary | ICD-10-CM | POA: Diagnosis not present

## 2021-05-22 MED ORDER — PANTOPRAZOLE SODIUM 40 MG PO TBEC: 40.0000 mg | DELAYED_RELEASE_TABLET | Freq: Every day | ORAL | 3 refills | Status: AC

## 2021-05-22 NOTE — Patient Instructions (Signed)
If you are age 28 or older, your body mass index should be between 23-30. Your Body mass index is 29.7 kg/m. If this is out of the aforementioned range listed, please consider follow up with your Primary Care Provider.  If you are age 66 or younger, your body mass index should be between 19-25. Your Body mass index is 29.7 kg/m. If this is out of the aformentioned range listed, please consider follow up with your Primary Care Provider.   ________________________________________________________  The [place] [facility] [facility] providers would like to encourage you to use [facility] to communicate with providers for non-urgent requests or questions.  Due to long hold times on the telephone, sending your provider a message by [facility] may be a faster and more efficient way to get a response.  Please allow 48 business hours for a response.  Please remember that this is for non-urgent requests.  _______________________________________________________  Chris West have been scheduled for an endoscopy. Please follow written instructions given to you at your visit today. If you use inhalers (even only as needed), please bring them with you on the day of your procedure.

## 2021-05-22 NOTE — Progress Notes (Signed)
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 Jonny Ruiz regarding problems with chronic abdominal pain.  Patient reports developing problems with mid to upper abdominal pain for about 8 months.  He states that this can occur several times per week.  It can last anywhere from 3 days to 2 weeks.  He does have classic reflux symptoms for which he takes Tums.  He took pantoprazole for about 1 month which helped his reflux symptoms, but not his abdominal pain.  He was also placed on sucralfate which did not help his abdominal pain.  He describes the discomfort as severe twisting pain or ache.  No radiation.  There is associated bloating.  He denies nausea, vomiting, change in bowel habits, rectal bleeding, or weight loss.  He has daily bowel movements.  His pain is not affected by activity or defecation.  He does feel that it is worse with meals.  He stopped NSAID use about 4 to 5 months ago.  He was evaluated by Dr. Jonny Ruiz March 14, 2021.  I have reviewed that encounter.  Blood work from that day was unremarkable including CBC, comprehensive metabolic panel with liver test, lipase, and Helicobacter pylori antibody.  Also, an abdominal ultrasound performed March 28, 2021 was unremarkable.  Patient does tell me that he has been under increased stress regarding his work as well as his anticipated wedding April 2023.  He currently works the night shift.  He smokes marijuana daily which he states helps his anxiety.  No family history of gastrointestinal malignancy  REVIEW OF SYSTEMS:  All non-GI ROS negative unless otherwise stated in the HPI except for anxiety  Past Medical History:  Diagnosis Date   Allergy    Migraine    Pneumonia     Past Surgical History:  Procedure Laterality Date   NO PAST SURGERIES      Social History Chris West  reports that he has been smoking cigars. He has  never used smokeless tobacco. He reports current alcohol use of about 4.0 standard drinks per week. He reports current drug use. Drug: Marijuana.  family history includes Breast cancer in his maternal grandmother and mother; Healthy in his maternal grandfather, paternal grandfather, paternal grandmother, and sister; Ovarian cancer in his mother; Prostate cancer in his father.  No Known Allergies     PHYSICAL EXAMINATION: Vital signs: BP 122/90 (BP Location: Left Arm, Patient Position: Sitting, Cuff Size: Normal)   Pulse 72   Ht 5\' 6"  (1.676 m)   Wt 184 lb (83.5 kg)   BMI 29.70 kg/m   Constitutional: generally well-appearing, no acute distress Psychiatric: alert and oriented x3, cooperative Eyes: extraocular movements intact, anicteric, conjunctiva pink Mouth: oral pharynx moist, no lesions Neck: supple no lymphadenopathy Cardiovascular: heart regular rate and rhythm, no murmur Lungs: clear to auscultation bilaterally Abdomen: soft, nontender, nondistended, no obvious ascites, no peritoneal signs, normal bowel sounds, no organomegaly Rectal: Omitted Extremities: no clubbing, cyanosis, or lower extremity edema bilaterally Skin: Multiple tattoos.  No additional relevant lesions on visible extremities Neuro: No focal deficits.  Cranial nerves intact  ASSESSMENT:  1.  Chronic abdominal pain of uncertain etiology.  Negative work-up to date as described.  No alarm features.  Question organic versus functional. 2.  Chronic GERD.  Currently untreated 3.  Anxiety   PLAN:  1.  Reflux precautions 2.  Prescribe pantoprazole 40 mg daily.  Medication  risks reviewed 3.  Schedule upper endoscopy.The nature of the procedure, as well as the risks, benefits, and alternatives were carefully and thoroughly reviewed with the patient. Ample time for discussion and questions allowed. The patient understood, was satisfied, and agreed to proceed. 4.  If the above unrevealing and PPI and helpful, then  consider advanced imaging with CT.  We discussed this.

## 2021-06-29 ENCOUNTER — Encounter: Payer: Self-pay | Admitting: Internal Medicine

## 2021-06-29 ENCOUNTER — Ambulatory Visit (AMBULATORY_SURGERY_CENTER): Payer: Managed Care, Other (non HMO) | Admitting: Internal Medicine

## 2021-06-29 VITALS — BP 139/84 | HR 82 | Temp 98.2°F | Resp 14 | Ht 66.0 in | Wt 184.0 lb

## 2021-06-29 DIAGNOSIS — R109 Unspecified abdominal pain: Secondary | ICD-10-CM

## 2021-06-29 DIAGNOSIS — R1013 Epigastric pain: Secondary | ICD-10-CM

## 2021-06-29 DIAGNOSIS — K219 Gastro-esophageal reflux disease without esophagitis: Secondary | ICD-10-CM

## 2021-06-29 MED ORDER — SODIUM CHLORIDE 0.9 % IV SOLN: 500.0000 mL | Freq: Once | INTRAVENOUS | Status: DC

## 2021-06-29 NOTE — Progress Notes (Signed)
Shortly after starting.the patient coughing and then to laryngospam.  Sats dropped and jaw thrust applied and o2 turned all the way up.  Pt sats came up and jaw thrust stopped but after few seconds pt spasmed again and jaw thrust reapplied and 60 mg of lido given.  Pt sats came up and jaw thrust stopped and pt cycled thru spasm again for third time.  Jaw thrust reapplied and 120mg  of lido given and spams broke for good.  Pt alloed to wake up in procedure room.  I did explain to pt that his jaw my be sore tonight and thru weekend.  VSS and BS clear in PACU

## 2021-06-29 NOTE — Patient Instructions (Signed)
YOU HAD AN ENDOSCOPIC PROCEDURE TODAY AT THE [facility]:   Refer to the procedure report that was given to you for any specific questions about what was found during the examination.  If the procedure report does not answer your questions, please call your gastroenterologist to clarify.  If you requested that your care partner not be given the details of your procedure findings, then the procedure report has been included in a sealed envelope for you to review at your convenience later.  YOU SHOULD EXPECT: Some feelings of bloating in the abdomen. Passage of more gas than usual.  Walking can help get rid of the air that was put into your GI tract during the procedure and reduce the bloating. If you had a lower endoscopy (such as a colonoscopy or flexible sigmoidoscopy) you may notice spotting of blood in your stool or on the toilet paper. If you underwent a bowel prep for your procedure, you may not have a normal bowel movement for a few days.  Please Note:  You might notice some irritation and congestion in your nose or some drainage.  This is from the oxygen used during your procedure.  There is no need for concern and it should clear up in a day or so.  SYMPTOMS TO REPORT IMMEDIATELY:  Following upper endoscopy (EGD)  Vomiting of blood or coffee ground material  New chest pain or pain under the shoulder blades  Painful or persistently difficult swallowing  New shortness of breath  Fever of 100F or higher  Black, tarry-looking stools  For urgent or emergent issues, a gastroenterologist can be reached at any hour by calling [phone]. Do not use MyChart messaging for urgent concerns.    DIET:  We do recommend a small meal at first, but then you may proceed to your regular diet.  Drink plenty of fluids but you should avoid alcoholic beverages for 24 hours.  ACTIVITY:  You should plan to take it easy for the rest of today and you should NOT DRIVE or use heavy machinery until  tomorrow (because of the sedation medicines used during the test).    FOLLOW UP: Our staff will call the number listed on your records 48-72 hours following your procedure to check on you and address any questions or concerns that you may have regarding the information given to you following your procedure. If we do not reach you, we will leave a message.  We will attempt to reach you two times.  During this call, we will ask if you have developed any symptoms of COVID 19. If you develop any symptoms (ie: fever, flu-like symptoms, shortness of breath, cough etc.) before then, please call [phone].  If you test positive for Covid 19 in the 2 weeks post procedure, please call and report this information to us.    SIGNATURES/CONFIDENTIALITY: You and/or your care partner have signed paperwork which will be entered into your electronic medical record.  These signatures attest to the fact that that the information above on your After Visit Summary has been reviewed and is understood.  Full responsibility of the confidentiality of this discharge information lies with you and/or your care-partner.  

## 2021-06-29 NOTE — Progress Notes (Signed)
HISTORY OF PRESENT ILLNESS:  The patient was seen in the office May 22, 2021.  See that full encounter as listed below.  Now for upper endoscopy.  No interval clinical changes or changes on physical exam.   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  Patient reports developing problems with mid to upper abdominal pain for about 8 months.  He states that this can occur several times per week.  It can last anywhere from 3 days to 2 weeks.  He does have classic reflux symptoms for which he takes Tums.  He took pantoprazole for about 1 month which helped his reflux symptoms, but not his abdominal pain.  He was also placed on sucralfate which did not help his abdominal pain.  He describes the discomfort as severe twisting pain or ache.  No radiation.  There is associated bloating.  He denies nausea, vomiting, change in bowel habits, rectal bleeding, or weight loss.  He has daily bowel movements.  His pain is not affected by activity or defecation.  He does feel that it is worse with meals.  He stopped NSAID use about 4 to 5 months ago.  He was evaluated by Dr. Jonny West March 14, 2021.  I have reviewed that encounter.  Blood work from that day was unremarkable including CBC, comprehensive metabolic panel with liver test, lipase, and Helicobacter pylori antibody.  Also, an abdominal ultrasound performed March 28, 2021 was unremarkable.  Patient does tell me that he has been under increased stress regarding his work as well as his anticipated wedding April 2023.  He currently works the night shift.  He smokes marijuana daily which he states helps his anxiety.  No family history of gastrointestinal malignancy   REVIEW OF SYSTEMS:   All non-GI ROS negative unless otherwise stated in the HPI except for anxiety       Past Medical History:  Diagnosis Date   Allergy      Migraine     Pneumonia             Past Surgical History:  Procedure Laterality Date   NO PAST SURGERIES          Social History [name]  reports that he has been smoking cigars. He has never used smokeless tobacco. He reports current alcohol use of about 4.0 standard drinks per week. He reports current drug use. Drug: Marijuana.   family history includes Breast cancer in his maternal grandmother and mother; Healthy in his maternal grandfather, paternal grandfather, paternal grandmother, and sister; Ovarian cancer in his mother; Prostate cancer in his father.   No Known Allergies       PHYSICAL EXAMINATION: Vital signs: BP 122/90 (BP Location: Left Arm, Patient Position: Sitting, Cuff Size: Normal)    Pulse 72    Ht 5\' 6"  (1.676 m)    Wt 184 lb (83.5 kg)    BMI 29.70 kg/m   Constitutional: generally well-appearing, no acute distress Psychiatric: alert and oriented x3, cooperative Eyes: extraocular movements intact, anicteric, conjunctiva pink Mouth: oral pharynx moist, no lesions Neck: supple no lymphadenopathy Cardiovascular: heart regular rate and rhythm, no murmur Lungs: clear to auscultation bilaterally Abdomen: soft, nontender, nondistended, no obvious ascites, no peritoneal signs, normal bowel sounds, no organomegaly Rectal: Omitted Extremities: no clubbing, cyanosis, or lower extremity edema bilaterally Skin: Multiple tattoos.  No additional relevant lesions on  visible extremities Neuro: No focal deficits.  Cranial nerves intact   ASSESSMENT:   1.  Chronic abdominal pain of uncertain etiology.  Negative work-up to date as described.  No alarm features.  Question organic versus functional. 2.  Chronic GERD.  Currently untreated 3.  Anxiety     PLAN:   1.  Reflux precautions 2.  Prescribe pantoprazole 40 mg daily.  Medication risks reviewed 3.  Schedule upper endoscopy.The nature of the procedure, as well as the risks, benefits, and alternatives were  carefully and thoroughly reviewed with the patient. Ample time for discussion and questions allowed. The patient understood, was satisfied, and agreed to proceed. 4.  If the above unrevealing and PPI and helpful, then consider advanced imaging with CT.  We discussed this.

## 2021-06-29 NOTE — Progress Notes (Signed)
Vitals-DT  Medical and surgical history reviewed and updated.

## 2021-06-29 NOTE — Op Note (Signed)
[facility] Patient Name: Chris West Procedure Date: 06/29/2021 3:51 PM MRN: [id number] Endoscopist: Wilhemina Bonito. Marina Goodell , MD Age: 30 Referring MD:  Date of Birth: March 31, 1991 Gender: Male Account #: [id number] Procedure:                Upper GI endoscopy Indications:              Epigastric abdominal pain, Esophageal reflux. He                            tells me that symptoms have been much better since                            initiating pantoprazole at the time of his office                            visit about 6 weeks ago Medicines:                Monitored Anesthesia Care Procedure:                Pre-Anesthesia Assessment:                           - Prior to the procedure, a History and Physical                            was performed, and patient medications and                            allergies were reviewed. The patient's tolerance of                            previous anesthesia was also reviewed. The risks                            and benefits of the procedure and the sedation                            options and risks were discussed with the patient.                            All questions were answered, and informed consent                            was obtained. Prior Anticoagulants: The patient has                            taken no previous anticoagulant or antiplatelet                            agents. ASA Grade Assessment: I - A normal, healthy                            patient. After reviewing the risks and benefits,  the patient was deemed in satisfactory condition to                            undergo the procedure.                           After obtaining informed consent, the endoscope was                            passed under direct vision. Throughout the                            procedure, the patient's blood pressure, pulse, and                            oxygen saturations were monitored  continuously. The                            Olympus [id number] #[id number] was introduced through                            the mouth, and advanced to the second part of                            duodenum. The upper GI endoscopy was accomplished                            without difficulty. The patient tolerated the                            procedure well. Scope In: Scope Out: Findings:                 The esophagus was normal.                           The stomach was normal save small hiatal hernia and                            nonspecific linear antral erythema.                           The examined duodenum was normal.                           The cardia and gastric fundus were normal on                            retroflexion. Complications:            No immediate complications. Estimated Blood Loss:     Estimated blood loss: none. Impression:               1. GERD                           2. Unremarkable EGD. Recommendation:           -  Patient has a contact number available for                            emergencies. The signs and symptoms of potential                            delayed complications were discussed with the                            patient. Return to normal activities tomorrow.                            Written discharge instructions were provided to the                            patient.                           - Resume previous diet.                           - Continue present medications.                           -Continue pantoprazole 40 mg daily                           -Office follow-up with Dr. Marina Goodell in 6 months.                            Contact the office in the interim for any questions                            or problems Soren Pigman N. Marina Goodell, MD 06/29/2021 4:09:22 PM This report has been signed electronically.

## 2021-07-03 ENCOUNTER — Telehealth: Payer: Self-pay | Admitting: *Deleted

## 2021-07-03 ENCOUNTER — Telehealth: Payer: Self-pay

## 2021-07-03 NOTE — Telephone Encounter (Signed)
Attempted followup call, no answer; left VM 

## 2021-07-03 NOTE — Telephone Encounter (Signed)
Second follow call attempt.  LVM

## 2021-08-30 ENCOUNTER — Other Ambulatory Visit: Payer: Self-pay

## 2021-08-30 ENCOUNTER — Encounter: Payer: Self-pay | Admitting: Emergency Medicine

## 2021-08-30 ENCOUNTER — Ambulatory Visit
Admission: EM | Admit: 2021-08-30 | Discharge: 2021-08-30 | Disposition: A | Discharge status: Home / Self Care | Payer: Managed Care, Other (non HMO) | Attending: Internal Medicine | Admitting: Internal Medicine

## 2021-08-30 ENCOUNTER — Ambulatory Visit (INDEPENDENT_AMBULATORY_CARE_PROVIDER_SITE_OTHER): Payer: Managed Care, Other (non HMO)

## 2021-08-30 DIAGNOSIS — M545 Low back pain, unspecified: Secondary | ICD-10-CM

## 2021-08-30 DIAGNOSIS — M25531 Pain in right wrist: Secondary | ICD-10-CM | POA: Diagnosis not present

## 2021-08-30 DIAGNOSIS — M79641 Pain in right hand: Secondary | ICD-10-CM

## 2021-08-30 MED ORDER — CYCLOBENZAPRINE HCL 5 MG PO TABS: 5.0000 mg | ORAL_TABLET | Freq: Two times a day (BID) | ORAL | 0 refills | Status: AC | PRN
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
## 2021-08-30 NOTE — Discharge Instructions (Addendum)
All x-rays were negative.  Please monitor abrasion for signs of infection.  Follow-up if this occurs.  You have been prescribed a muscle relaxer to take as needed for pain.  Please be advised that this can cause drowsiness.

## 2022-02-04 ENCOUNTER — Encounter: Payer: Self-pay | Admitting: Internal Medicine
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
